# Patient Record
Sex: Female | Born: 1985 | Race: Black or African American | Hispanic: No | Marital: Single | Smoking: Never smoker
Health system: Southern US, Community
[De-identification: ages and names within clinical notes are randomized; demographics above are authoritative.]

## PROBLEM LIST (undated history)

## (undated) ENCOUNTER — Inpatient Hospital Stay (HOSPITAL_COMMUNITY): Payer: Self-pay

## (undated) DIAGNOSIS — O139 Gestational [pregnancy-induced] hypertension without significant proteinuria, unspecified trimester: Secondary | ICD-10-CM

## (undated) HISTORY — PX: NO PAST SURGERIES: SHX2092

---
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
## 2012-11-28 NOTE — L&D Delivery Note (Signed)
Delivery Note At 2:46 PM a viable female was delivered via  (Presentation: LOA).  APGAR: 9,9; weight - pending. Placenta status: Intact, Spontaneous.  Cord:  3 vessel with the following complications: None.  Anesthesia:  None Episiotomy: None Lacerations: None Suture Repair: N/A Est. Blood Loss (mL): 50 mL  Mom to postpartum.  Baby to nursery-stable. Will obtain stat Prenatal labs.   Everlene Other 05/24/2013, 3:04 PM  Attended delivery also Agree with note BP noted to be quite elevated after delivery PIH labs ordered and Magnesium Sulfate started Wynelle Bourgeois CNM

## 2012-11-28 NOTE — L&D Delivery Note (Signed)
Attestation of Attending Supervision of Advanced Practitioner (PA/CNM/NP): Evaluation and management procedures were performed by the Advanced Practitioner under my supervision and collaboration.  I have reviewed the Advanced Practitioner's note and chart, and I agree with the management and plan.  Mavis Gravelle, MD, FACOG Attending Obstetrician & Gynecologist Faculty Practice, [facility]  

## 2013-05-24 ENCOUNTER — Encounter (HOSPITAL_COMMUNITY): Payer: Self-pay

## 2013-05-24 ENCOUNTER — Inpatient Hospital Stay (HOSPITAL_COMMUNITY)
Admission: AD | Admit: 2013-05-24 | Discharge: 2013-05-26 | DRG: 774 | Disposition: A | Discharge status: Home / Self Care | Payer: MEDICAID | Source: Ambulatory Visit | Attending: Obstetrics & Gynecology | Admitting: Obstetrics & Gynecology

## 2013-05-24 DIAGNOSIS — D649 Anemia, unspecified: Secondary | ICD-10-CM

## 2013-05-24 DIAGNOSIS — IMO0002 Reserved for concepts with insufficient information to code with codable children: Principal | ICD-10-CM | POA: Diagnosis not present

## 2013-05-24 DIAGNOSIS — O9903 Anemia complicating the puerperium: Secondary | ICD-10-CM | POA: Diagnosis not present

## 2013-05-24 DIAGNOSIS — O0930 Supervision of pregnancy with insufficient antenatal care, unspecified trimester: Secondary | ICD-10-CM

## 2013-05-24 DIAGNOSIS — O1493 Unspecified pre-eclampsia, third trimester: Secondary | ICD-10-CM

## 2013-05-24 HISTORY — DX: Gestational (pregnancy-induced) hypertension without significant proteinuria, unspecified trimester: O13.9

## 2013-05-24 LAB — CBC
MCH: 20.4 pg — ABNORMAL LOW (ref 26.0–34.0)
RBC: 4.26 MIL/uL (ref 3.87–5.11)
RDW: 18.3 % — ABNORMAL HIGH (ref 11.5–15.5)
WBC: 11.1 10*3/uL — ABNORMAL HIGH (ref 4.0–10.5)

## 2013-05-24 LAB — PROTEIN / CREATININE RATIO, URINE
Creatinine, Urine: 64.47 mg/dL
Total Protein, Urine: 178.9 mg/dL

## 2013-05-24 LAB — COMPREHENSIVE METABOLIC PANEL
ALT: 5 U/L (ref 0–35)
AST: 14 U/L (ref 0–37)
BUN: <3 mg/dL — ABNORMAL LOW (ref 6–23)
Calcium: 8.9 mg/dL (ref 8.4–10.5)
Chloride: 102 mEq/L (ref 96–112)
Creatinine, Ser: 0.58 mg/dL (ref 0.50–1.10)
GFR calc Af Amer: >90 mL/min (ref >90)
Total Bilirubin: 0.6 mg/dL (ref 0.3–1.2)

## 2013-05-24 LAB — RAPID URINE DRUG SCREEN, HOSP PERFORMED
Amphetamines: NOT DETECTED
Barbiturates: NOT DETECTED
Tetrahydrocannabinol: NOT DETECTED

## 2013-05-24 LAB — DIFFERENTIAL
Basophils Absolute: 0 10*3/uL (ref 0.0–0.1)
Basophils Relative: 0 % (ref 0–1)
Eosinophils Absolute: 0.1 10*3/uL (ref 0.0–0.7)
Eosinophils Relative: 1 % (ref 0–5)
Lymphocytes Relative: 12 % (ref 12–46)
Lymphs Abs: 1.4 10*3/uL (ref 0.7–4.0)
Monocytes Relative: 5 % (ref 3–12)
Neutro Abs: 9.2 10*3/uL — ABNORMAL HIGH (ref 1.7–7.7)

## 2013-05-24 LAB — HEPATITIS B SURFACE ANTIGEN: Hepatitis B Surface Ag: NEGATIVE

## 2013-05-24 LAB — RPR: RPR Ser Ql: NONREACTIVE

## 2013-05-24 LAB — TYPE AND SCREEN
ABO/RH(D): A POS
Antibody Screen: NEGATIVE

## 2013-05-24 LAB — RAPID HIV SCREEN (WH-MAU): Rapid HIV Screen: NONREACTIVE

## 2013-05-24 MED ORDER — OXYTOCIN 10 UNIT/ML IJ SOLN
10.0000 [IU] | Freq: Once | INTRAMUSCULAR | Status: AC
  Administered 2013-05-24: 10 [IU] via INTRAMUSCULAR

## 2013-05-24 MED ORDER — LIDOCAINE HCL (PF) 1 % IJ SOLN
30.0000 mL | INTRAMUSCULAR | Status: DC | PRN
  Filled 2013-05-24: qty 30

## 2013-05-24 MED ORDER — SENNOSIDES-DOCUSATE SODIUM 8.6-50 MG PO TABS
2.0000 | ORAL_TABLET | Freq: Every day | ORAL | Status: DC
  Administered 2013-05-24 – 2013-05-25 (×2): 2 via ORAL

## 2013-05-24 MED ORDER — CITRIC ACID-SODIUM CITRATE 334-500 MG/5ML PO SOLN: 30.0000 mL | ORAL | Status: DC | PRN

## 2013-05-24 MED ORDER — IBUPROFEN 600 MG PO TABS: 600.0000 mg | ORAL_TABLET | Freq: Four times a day (QID) | ORAL | Status: DC | PRN

## 2013-05-24 MED ORDER — POTASSIUM CHLORIDE 10 MEQ/100ML IV SOLN
10.0000 meq | INTRAVENOUS | Status: AC
  Administered 2013-05-24 (×2): 10 meq via INTRAVENOUS
  Filled 2013-05-24 (×2): qty 100

## 2013-05-24 MED ORDER — HYDRALAZINE HCL 20 MG/ML IJ SOLN
INTRAMUSCULAR | Status: AC
  Administered 2013-05-24: 20 mg
  Filled 2013-05-24: qty 1

## 2013-05-24 MED ORDER — ONDANSETRON HCL 4 MG PO TABS: 4.0000 mg | ORAL_TABLET | ORAL | Status: DC | PRN

## 2013-05-24 MED ORDER — ACETAMINOPHEN 325 MG PO TABS: 650.0000 mg | ORAL_TABLET | ORAL | Status: DC | PRN

## 2013-05-24 MED ORDER — ONDANSETRON HCL 4 MG/2ML IJ SOLN: 4.0000 mg | Freq: Four times a day (QID) | INTRAMUSCULAR | Status: DC | PRN

## 2013-05-24 MED ORDER — HYDRALAZINE HCL 20 MG/ML IJ SOLN: 20.0000 mg | INTRAMUSCULAR | Status: DC | PRN

## 2013-05-24 MED ORDER — SIMETHICONE 80 MG PO CHEW: 80.0000 mg | CHEWABLE_TABLET | ORAL | Status: DC | PRN

## 2013-05-24 MED ORDER — MAGNESIUM SULFATE 40 G IN LACTATED RINGERS - SIMPLE
2.0000 g/h | INTRAVENOUS | Status: DC
  Administered 2013-05-24: 2 g/h via INTRAVENOUS
  Filled 2013-05-24: qty 500

## 2013-05-24 MED ORDER — OXYTOCIN BOLUS FROM INFUSION: 500.0000 mL | INTRAVENOUS | Status: DC

## 2013-05-24 MED ORDER — IBUPROFEN 600 MG PO TABS
600.0000 mg | ORAL_TABLET | Freq: Four times a day (QID) | ORAL | Status: DC
Start: 2013-05-24 — End: 2013-05-26
  Administered 2013-05-24 – 2013-05-26 (×8): 600 mg via ORAL
  Filled 2013-05-24 (×8): qty 1

## 2013-05-24 MED ORDER — OXYCODONE-ACETAMINOPHEN 5-325 MG PO TABS: 1.0000 | ORAL_TABLET | ORAL | Status: DC | PRN

## 2013-05-24 MED ORDER — DIPHENHYDRAMINE HCL 25 MG PO CAPS: 25.0000 mg | ORAL_CAPSULE | Freq: Four times a day (QID) | ORAL | Status: DC | PRN

## 2013-05-24 MED ORDER — BENZOCAINE-MENTHOL 20-0.5 % EX AERO: 1.0000 "application " | INHALATION_SPRAY | CUTANEOUS | Status: DC | PRN

## 2013-05-24 MED ORDER — LACTATED RINGERS IV SOLN
INTRAVENOUS | Status: DC
  Administered 2013-05-25: 06:00:00 via INTRAVENOUS

## 2013-05-24 MED ORDER — LACTATED RINGERS IV SOLN
INTRAVENOUS | Status: DC
  Administered 2013-05-24: 17:00:00 via INTRAVENOUS

## 2013-05-24 MED ORDER — TETANUS-DIPHTH-ACELL PERTUSSIS 5-2.5-18.5 LF-MCG/0.5 IM SUSP
0.5000 mL | Freq: Once | INTRAMUSCULAR | Status: AC
  Administered 2013-05-25: 0.5 mL via INTRAMUSCULAR
  Filled 2013-05-24: qty 0.5

## 2013-05-24 MED ORDER — OXYTOCIN 40 UNITS IN LACTATED RINGERS INFUSION - SIMPLE MED: 62.5000 mL/h | INTRAVENOUS | Status: DC | PRN

## 2013-05-24 MED ORDER — WITCH HAZEL-GLYCERIN EX PADS: 1.0000 "application " | MEDICATED_PAD | CUTANEOUS | Status: DC | PRN

## 2013-05-24 MED ORDER — LANOLIN HYDROUS EX OINT: TOPICAL_OINTMENT | CUTANEOUS | Status: DC | PRN

## 2013-05-24 MED ORDER — MAGNESIUM SULFATE BOLUS VIA INFUSION
4.0000 g | Freq: Once | INTRAVENOUS | Status: AC
  Administered 2013-05-24: 4 g via INTRAVENOUS
  Filled 2013-05-24: qty 500

## 2013-05-24 MED ORDER — POTASSIUM CHLORIDE 10 MEQ/100ML IV SOLN
10.0000 meq | INTRAVENOUS | Status: DC
  Administered 2013-05-24 (×3): 10 meq via INTRAVENOUS
  Filled 2013-05-24 (×5): qty 100

## 2013-05-24 MED ORDER — ONDANSETRON HCL 4 MG/2ML IJ SOLN: 4.0000 mg | INTRAMUSCULAR | Status: DC | PRN

## 2013-05-24 MED ORDER — ZOLPIDEM TARTRATE 5 MG PO TABS: 5.0000 mg | ORAL_TABLET | Freq: Every evening | ORAL | Status: DC | PRN

## 2013-05-24 MED ORDER — PRENATAL MULTIVITAMIN CH
1.0000 | ORAL_TABLET | Freq: Every day | ORAL | Status: DC
  Administered 2013-05-25: 1 via ORAL
  Filled 2013-05-24: qty 1

## 2013-05-24 MED ORDER — MAGNESIUM SULFATE 40 G IN LACTATED RINGERS - SIMPLE
2.0000 g/h | INTRAVENOUS | Status: AC
  Filled 2013-05-24: qty 500

## 2013-05-24 MED ORDER — LABETALOL HCL 5 MG/ML IV SOLN: 10.0000 mg | Freq: Once | INTRAVENOUS | Status: DC

## 2013-05-24 MED ORDER — PRENATAL MULTIVITAMIN CH: 1.0000 | ORAL_TABLET | Freq: Every day | ORAL | Status: DC

## 2013-05-24 MED ORDER — HYDRALAZINE HCL 20 MG/ML IJ SOLN
10.0000 mg | INTRAMUSCULAR | Status: DC | PRN
  Administered 2013-05-24: 10 mg via INTRAVENOUS
  Filled 2013-05-24: qty 1

## 2013-05-24 MED ORDER — OXYTOCIN 40 UNITS IN LACTATED RINGERS INFUSION - SIMPLE MED: 62.5000 mL/h | INTRAVENOUS | Status: DC

## 2013-05-24 MED ORDER — OXYTOCIN 10 UNIT/ML IJ SOLN
INTRAMUSCULAR | Status: AC
  Filled 2013-05-24: qty 2

## 2013-05-24 MED ORDER — FLEET ENEMA 7-19 GM/118ML RE ENEM: 1.0000 | ENEMA | RECTAL | Status: DC | PRN

## 2013-05-24 MED ORDER — LABETALOL HCL 5 MG/ML IV SOLN
10.0000 mg | INTRAVENOUS | Status: DC | PRN
  Administered 2013-05-24 (×2): 10 mg via INTRAVENOUS
  Filled 2013-05-24 (×2): qty 4

## 2013-05-24 MED ORDER — LACTATED RINGERS IV SOLN: 500.0000 mL | INTRAVENOUS | Status: DC | PRN

## 2013-05-24 MED ORDER — OXYCODONE-ACETAMINOPHEN 5-325 MG PO TABS
1.0000 | ORAL_TABLET | ORAL | Status: DC | PRN
  Administered 2013-05-24 – 2013-05-26 (×4): 1 via ORAL
  Filled 2013-05-24: qty 2
  Filled 2013-05-24 (×4): qty 1

## 2013-05-24 MED ORDER — DIBUCAINE 1 % RE OINT: 1.0000 "application " | TOPICAL_OINTMENT | RECTAL | Status: DC | PRN

## 2013-05-24 NOTE — H&P (Signed)
Whitney Abbott is a 27 y.o. G4P3 who came via EMS with eminent delivery. Patient has recently relocated from [place] [facility]); she received prenatal care there.  History No complications during pregnancy per patient.   Labs:  Prenatal labs not available.  ROS Unable to obtain.  Exam Physical Exam  Gen: in moderate distress secondary to pain. Cervical exam: Dilation: 10 Dilation Complete Date: 05/24/13 Dilation Complete Time: 1436 Cervical Position: Anterior Station: +2 Presentation: Vertex Exam by:: m wilkins rnc  Assessment/Plan: CHARMIAN FORBIS is a 27 y.o. G4P3 who presents via EMS. Delivery eminent. - Patient delivered viable girl without complications.   Everlene Other 05/24/2013, 3:07 PM  Seen Dollene Cleveland me Agree with note Wynelle Bourgeois CNM

## 2013-05-25 LAB — BASIC METABOLIC PANEL
BUN: 4 mg/dL — ABNORMAL LOW (ref 6–23)
CO2: 24 mEq/L (ref 19–32)
Chloride: 104 mEq/L (ref 96–112)
GFR calc non Af Amer: >90 mL/min (ref >90)
Glucose, Bld: 116 mg/dL — ABNORMAL HIGH (ref 70–99)
Potassium: 2.8 mEq/L — ABNORMAL LOW (ref 3.5–5.1)

## 2013-05-25 LAB — CBC
Hemoglobin: 7.2 g/dL — ABNORMAL LOW (ref 12.0–15.0)
MCHC: 30.5 g/dL (ref 30.0–36.0)
RBC: 3.46 MIL/uL — ABNORMAL LOW (ref 3.87–5.11)

## 2013-05-25 LAB — COMPREHENSIVE METABOLIC PANEL
ALT: <5 U/L (ref 0–35)
Alkaline Phosphatase: 158 U/L — ABNORMAL HIGH (ref 39–117)
GFR calc Af Amer: >90 mL/min (ref >90)
Glucose, Bld: 80 mg/dL (ref 70–99)
Potassium: 2.5 mEq/L — CL (ref 3.5–5.1)
Sodium: 138 mEq/L (ref 135–145)
Total Protein: 5.1 g/dL — ABNORMAL LOW (ref 6.0–8.3)

## 2013-05-25 MED ORDER — POTASSIUM CHLORIDE 10 MEQ/100ML IV SOLN
10.0000 meq | INTRAVENOUS | Status: AC
  Administered 2013-05-25 (×4): 10 meq via INTRAVENOUS
  Filled 2013-05-25 (×4): qty 100

## 2013-05-25 MED ORDER — LABETALOL HCL 200 MG PO TABS
400.0000 mg | ORAL_TABLET | Freq: Two times a day (BID) | ORAL | Status: DC
  Administered 2013-05-26: 400 mg via ORAL
  Filled 2013-05-25 (×3): qty 2

## 2013-05-25 MED ORDER — LABETALOL HCL 200 MG PO TABS
200.0000 mg | ORAL_TABLET | Freq: Two times a day (BID) | ORAL | Status: DC
  Administered 2013-05-25 (×2): 200 mg via ORAL
  Filled 2013-05-25 (×3): qty 1

## 2013-05-25 MED ORDER — LABETALOL HCL 200 MG PO TABS
200.0000 mg | ORAL_TABLET | Freq: Once | ORAL | Status: AC
  Administered 2013-05-25: 200 mg via ORAL
  Filled 2013-05-25: qty 1

## 2013-05-25 MED ORDER — POTASSIUM CHLORIDE 10 MEQ/100ML IV SOLN
10.0000 meq | INTRAVENOUS | Status: AC
  Administered 2013-05-25 – 2013-05-26 (×4): 10 meq via INTRAVENOUS
  Filled 2013-05-25 (×4): qty 100

## 2013-05-25 NOTE — Progress Notes (Signed)
Post Partum Day 1, minimal prenatal care Subjective: no complaints and tolerating PO  Objective: Blood pressure 157/95, pulse 91, temperature 97.6 F (36.4 C), temperature source Oral, resp. rate 16, height 5\' 4"  (1.626 m), weight 185 lb 3 oz (84 kg), SpO2 98.00%, unknown if currently breastfeeding.  Physical Exam:  General: alert, cooperative and no distress Lochia: appropriate Uterine Fundus: firm Incision: na DVT Evaluation: No evidence of DVT seen on physical exam.  Results for orders placed during the hospital encounter of 05/24/13 (from the past 6 hour(s))  CBC   Collection Time    05/25/13  5:50 AM      Result Value Range   WBC 11.6 (*) 4.0 - 10.5 K/uL   RBC 3.46 (*) 3.87 - 5.11 MIL/uL   Hemoglobin 7.2 (*) 12.0 - 15.0 g/dL   HCT 21.3 (*) 08.6 - 57.8 %   MCV 67.3 (*) 78.0 - 100.0 fL   MCH 20.5 (*) 26.0 - 34.0 pg   MCHC 30.5  30.0 - 36.0 g/dL   RDW 46.9 (*) 62.9 - 52.8 %   Platelets 220  150 - 400 K/uL     CMP pending Assessment/Plan: PPD #1: Pre eclampsia Anemic  Start Labetalol 200 mg BID Checking K, pending Stop Magenesium later   LOS: 1 day   EURE,LUTHER H 05/25/2013, 7:14 AM

## 2013-05-25 NOTE — Plan of Care (Signed)
Problem: Discharge Progression Outcomes Goal: Other Discharge Outcomes/Goals Pt is planning for baby to be adopted.  SW and [facility] involved.

## 2013-05-25 NOTE — Progress Notes (Signed)
Results for SHAYLEA, [facility] (MRN [id number]) as of 05/25/2013 08:01  Ref. Range 05/25/2013 05:50  Potassium Latest Range: 3.5-5.1 mEq/L 2.5 (LL)  Dr. Despina Hidden notified. Orders received.

## 2013-05-25 NOTE — H&P (Signed)
Attestation of Attending Supervision of Advanced Practitioner (PA/CNM/NP): Evaluation and management procedures were performed by the Advanced Practitioner under my supervision and collaboration.  I have reviewed the Advanced Practitioner's note and chart, and I agree with the management and plan.  Tasha Jindra, MD, FACOG Attending Obstetrician & Gynecologist Faculty Practice, [facility]  

## 2013-05-25 NOTE — Progress Notes (Signed)
Pt transferred stable and ambulatory from AICU to room 320.RN received report from [employer] RN.

## 2013-05-25 NOTE — Clinical Social Work Maternal (Signed)
Clinical Social Work Department PSYCHOSOCIAL ASSESSMENT - MATERNAL/CHILD 05/25/2013  Patient:  Whitney Abbott, Whitney Abbott  Account Number:  [id number]  Admit Date:  05/24/2013  Marjo Bicker Name:   Whitney Abbott    Clinical Social Worker:  Nobie Putnam, LCSW   Date/Time:  05/25/2013 11:58 AM  Date Referred:  05/25/2013   Referral source  CN     Referred reason  Adoption  Other - See comment   Other referral source:    I:  FAMILY / HOME ENVIRONMENT Child's legal guardian:  PARENT  Guardian - Name Guardian - Age Guardian - Address  Ranya Fiddler 26 [address]; [place], [place] [address]  Lannie Fields 25 [place]   Other household support members/support persons Name Relationship DOB  Fuller [place] AUNT    SON 74 years old   DAUGHTER 36 years old   DAUGHTER 47 years old   Other support:   Pt's mother  Daughter's grandmother    II  PSYCHOSOCIAL DATA Information Source:  Patient Interview  Event organiser Employment:   Surveyor, quantity resources:  Self Pay If [employer]:   Building services engineer / Grade:   Maternity Care Coordinator / Child Services Coordination / Early Interventions:  Cultural issues impacting care:    III  STRENGTHS Strengths  Supportive family/friends   Strength comment:    IV  RISK FACTORS AND CURRENT PROBLEMS Current Problem:  YES   Risk Factor & Current Problem Patient Issue Family Issue Risk Factor / Current Problem Comment  Other - See comment Y N NPNC  Other - See comment Y N ?able Adoption    V  SOCIAL WORK ASSESSMENT CSW met with pt to assess reason for [facility].  Pt did not established PNC due to her instability & lack of transportation.  She is originally from [place].  Pt moved to [place], [place] with her mother & stayed for 6 months. Pt described the town as [place], [place] without public transportation. She decided to relocate to the area 1 month ago to live with her aunt.  During assessment pt asked "what can I do if I don't want to  keep the baby?" CSW discussed adoption services available to her & offered to facilitate the process.  Pt told CSW that she is having a difficult time raising 3 young children alone.  Due to her lack of finances, she thinks it will be even more difficult to "get on her feet."  She talked about her desire to finish college & adding a baby to the situation will further delay her goals.  CSW educated pt on [place] adoption laws & differences between open/closed adoptions.  The FOB is in [place] & not involved.  The pt talked about her decision with her mother during the pregnancy & has her support. She has not purchased any supplies for the infant.  CSW provided pt with a list of adoption agencies & pt asked CSW to call [facility].  CSW spoke with Paulette, adoption SW & she plans to come meet with the pt today.  Pt denies any illegal substance use & verbalized understanding of hospital drug testing policy.  UDS is negative, meconium results are pending.  Pt would like to continue to have contact with the infant at this time.  Pt seems to be ambivalent about her decision to make an adoption plan at this time however interested in speaking with adoption SW. CSW will continue to assist the pt as needed &  monitor drug screen results.      VI SOCIAL WORK PLAN Social Work Plan  No Further Intervention Required / No Barriers to Discharge   Type of pt/family education:   If child protective services report - county:   If child protective services report - date:   Information/referral to community resources comment:   Other social work plan:

## 2013-05-26 LAB — COMPREHENSIVE METABOLIC PANEL
BUN: 5 mg/dL — ABNORMAL LOW (ref 6–23)
CO2: 24 mEq/L (ref 19–32)
Chloride: 107 mEq/L (ref 96–112)
Creatinine, Ser: 0.68 mg/dL (ref 0.50–1.10)
GFR calc Af Amer: >90 mL/min (ref >90)
GFR calc non Af Amer: >90 mL/min (ref >90)
Glucose, Bld: 73 mg/dL (ref 70–99)
Total Bilirubin: 0.3 mg/dL (ref 0.3–1.2)

## 2013-05-26 MED ORDER — IBUPROFEN 600 MG PO TABS: 600.0000 mg | ORAL_TABLET | Freq: Four times a day (QID) | ORAL | Status: DC

## 2013-05-26 MED ORDER — LABETALOL HCL 200 MG PO TABS: 400.0000 mg | ORAL_TABLET | Freq: Two times a day (BID) | ORAL | Status: DC

## 2013-05-26 NOTE — Progress Notes (Signed)
Adoption SW, Mel Almond came to meet with the pt yesterday afternoon to discuss adoption plan.  MOB plans to sign relinquishment paperwork this morning.  She does not appear emotional & states she is confident with her decision.  Pt's aunt did not know she was pregnant.  She plans to discharge home today.  CSW offered to make a counseling referral however pt declined.  CSW will assist Paulette when she arrives.

## 2013-05-26 NOTE — Discharge Summary (Signed)
Obstetric Discharge Summary Reason for Admission: onset of labor Prenatal Procedures: limited prenatal care in [place] Intrapartum Procedures: spontaneous vaginal delivery Postpartum Procedures: preeclampsia Hemoglobin  Date Value Range Status  05/25/2013 7.2* 12.0 - 15.0 g/dL Final     REPEATED TO VERIFY     DELTA CHECK NOTED     HCT  Date Value Range Status  05/25/2013 23.3* 36.0 - 46.0 % Final   Patient was brought in by EMS and delivered a viable female infant in MAU (no laceration). Patient with limited prenatal care in [place], noted to have elevated BP postpartum. Patient diagnosed with preeclampsia and received magnesium sulfate for seizure prophylaxis. Patient also started on labetalol. Patient was found to be hypokalemic which was corrected with IV KCL infusions. On day of discharge, patient reports feeling well without any symptoms. She is not interested in breast feeding. She plans on using depo-provera or Nexplanon for birth control. Appointments will be scheduled for her in the next 2-4 weeks for depo-provera and postpartum visit. In the mean time baby love nurse will make a home assessment with BP check. Patient advised to return to MAU if she develops headaches, visual disturbances, RUQ/epigastric pain. Patient advised to abstain from intercourse until postpartum visit.   Physical Exam:  General: alert, cooperative and no distress Lochia: appropriate Uterine Fundus: firm, NT DVT Evaluation: No cords or calf tenderness. No significant calf/ankle edema.  Discharge Diagnoses: Term Pregnancy-delivered and Preelampsia  Discharge Information: Date: 05/26/2013 Activity: pelvic rest Diet: routine Medications: Ibuprofen and labetalol Condition: stable Instructions: refer to practice specific booklet Discharge to: home Follow-up Information   Follow up with [facility] In 2 weeks. (Depo-provera)    Contact information:   [address] [place]  [address] [phone]      Newborn Data: Live born female  Birth Weight: 7 lb 5.6 oz (3334 g) APGAR: 9, 9  Home with mother.  Whitney Abbott 05/26/2013, 7:10 AM

## 2013-05-27 NOTE — Progress Notes (Signed)
Ur chart review completed.  

## 2013-06-27 ENCOUNTER — Ambulatory Visit: Payer: Self-pay | Admitting: Obstetrics and Gynecology

## 2013-09-02 ENCOUNTER — Ambulatory Visit: Payer: Self-pay | Admitting: Internal Medicine

## 2013-12-16 LAB — OB RESULTS CONSOLE GBS: STREP GROUP B AG: POSITIVE

## 2014-01-31 ENCOUNTER — Ambulatory Visit: Payer: Self-pay

## 2014-06-10 ENCOUNTER — Emergency Department (HOSPITAL_COMMUNITY)
Admission: EM | Admit: 2014-06-10 | Discharge: 2014-06-10 | Disposition: A | Discharge status: Home / Self Care | Payer: Medicaid Other | Attending: Emergency Medicine | Admitting: Emergency Medicine

## 2014-06-10 ENCOUNTER — Encounter (HOSPITAL_COMMUNITY): Payer: Self-pay | Admitting: Emergency Medicine

## 2014-06-10 DIAGNOSIS — Z8679 Personal history of other diseases of the circulatory system: Secondary | ICD-10-CM | POA: Insufficient documentation

## 2014-06-10 DIAGNOSIS — O210 Mild hyperemesis gravidarum: Secondary | ICD-10-CM | POA: Insufficient documentation

## 2014-06-10 DIAGNOSIS — O219 Vomiting of pregnancy, unspecified: Secondary | ICD-10-CM

## 2014-06-10 LAB — PREGNANCY, URINE: PREG TEST UR: POSITIVE — AB

## 2014-06-10 LAB — CBC WITH DIFFERENTIAL/PLATELET
BASOS ABS: 0 10*3/uL (ref 0.0–0.1)
BASOS PCT: 0 % (ref 0–1)
Eosinophils Absolute: 0.1 10*3/uL (ref 0.0–0.7)
Eosinophils Relative: 1 % (ref 0–5)
HCT: 37.8 % (ref 36.0–46.0)
HEMOGLOBIN: 12.5 g/dL (ref 12.0–15.0)
LYMPHS PCT: 26 % (ref 12–46)
Lymphs Abs: 1.5 10*3/uL (ref 0.7–4.0)
MCH: 27.2 pg (ref 26.0–34.0)
MCHC: 33.1 g/dL (ref 30.0–36.0)
MCV: 82.2 fL (ref 78.0–100.0)
MONOS PCT: 6 % (ref 3–12)
Monocytes Absolute: 0.3 10*3/uL (ref 0.1–1.0)
NEUTROS ABS: 3.9 10*3/uL (ref 1.7–7.7)
NEUTROS PCT: 67 % (ref 43–77)
Platelets: 253 10*3/uL (ref 150–400)
RBC: 4.6 MIL/uL (ref 3.87–5.11)
RDW: 15.7 % — AB (ref 11.5–15.5)
WBC: 5.9 10*3/uL (ref 4.0–10.5)

## 2014-06-10 LAB — URINALYSIS, ROUTINE W REFLEX MICROSCOPIC
Bilirubin Urine: NEGATIVE
Glucose, UA: NEGATIVE mg/dL
Hgb urine dipstick: NEGATIVE
Ketones, ur: 40 mg/dL — AB
LEUKOCYTES UA: NEGATIVE
NITRITE: NEGATIVE
Protein, ur: NEGATIVE mg/dL
SPECIFIC GRAVITY, URINE: 1.02 (ref 1.005–1.030)
UROBILINOGEN UA: 1 mg/dL (ref 0.0–1.0)
pH: 7.5 (ref 5.0–8.0)

## 2014-06-10 LAB — URINE MICROSCOPIC-ADD ON

## 2014-06-10 LAB — BASIC METABOLIC PANEL
ANION GAP: 16 — AB (ref 5–15)
BUN: 7 mg/dL (ref 6–23)
CHLORIDE: 102 meq/L (ref 96–112)
CO2: 21 meq/L (ref 19–32)
Calcium: 9.5 mg/dL (ref 8.4–10.5)
Creatinine, Ser: 0.65 mg/dL (ref 0.50–1.10)
GFR calc non Af Amer: >90 mL/min (ref >90)
Glucose, Bld: 75 mg/dL (ref 70–99)
POTASSIUM: 4.4 meq/L (ref 3.7–5.3)
Sodium: 139 mEq/L (ref 137–147)

## 2014-06-10 MED ORDER — SODIUM CHLORIDE 0.9 % IV BOLUS (SEPSIS)
1000.0000 mL | Freq: Once | INTRAVENOUS | Status: AC
  Administered 2014-06-10: 1000 mL via INTRAVENOUS

## 2014-06-10 MED ORDER — ONDANSETRON HCL 4 MG/2ML IJ SOLN
4.0000 mg | Freq: Once | INTRAMUSCULAR | Status: AC
  Administered 2014-06-10: 4 mg via INTRAVENOUS
  Filled 2014-06-10: qty 2

## 2014-06-10 MED ORDER — PRENATAL COMPLETE 14-0.4 MG PO TABS: 1.0000 | ORAL_TABLET | Freq: Every day | ORAL | Status: DC

## 2014-06-10 MED ORDER — ONDANSETRON HCL 4 MG PO TABS: 4.0000 mg | ORAL_TABLET | Freq: Four times a day (QID) | ORAL | Status: DC

## 2014-06-10 NOTE — Discharge Instructions (Signed)
Hyperemesis Gravidarum °Hyperemesis gravidarum is a severe form of nausea and vomiting that happens during pregnancy. Hyperemesis is worse than morning sickness. It may cause you to have nausea or vomiting all day for many days. It may keep you from eating and drinking enough food and liquids. Hyperemesis usually occurs during the first half (the first 20 weeks) of pregnancy. It often goes away once a woman is in her second half of pregnancy. However, sometimes hyperemesis continues through an entire pregnancy.  °CAUSES  °The cause of this condition is not completely known but is thought to be related to changes in the body's hormones when pregnant. It could be from the high level of the pregnancy hormone or an increase in estrogen in the body.  °SIGNS AND SYMPTOMS  °· Severe nausea and vomiting. °· Nausea that does not go away. °· Vomiting that does not allow you to keep any food down. °· Weight loss and body fluid loss (dehydration). °· Having no desire to eat or not liking food you have previously enjoyed. °DIAGNOSIS  °Your health care provider will do a physical exam and ask you about your symptoms. He or she may also order blood tests and urine tests to make sure something else is not causing the problem.  °TREATMENT  °You may only need medicine to control the problem. If medicines do not control the nausea and vomiting, you will be treated in the hospital to prevent dehydration, increased acid in the blood (acidosis), weight loss, and changes in the electrolytes in your body that may harm the unborn baby (fetus). You may need IV fluids.  °HOME CARE INSTRUCTIONS  °· Only take over-the-counter or prescription medicines as directed by your health care provider. °· Try eating a couple of dry crackers or toast in the morning before getting out of bed. °· Avoid foods and smells that upset your stomach. °· Avoid fatty and spicy foods. °· Eat 5-6 small meals a day. °· Do not drink when eating meals. Drink between  meals. °· For snacks, eat high-protein foods, such as cheese. °· Eat or suck on things that have ginger in them. Ginger helps nausea. °· Avoid food preparation. The smell of food can spoil your appetite. °· Avoid iron pills and iron in your multivitamins until after 3-4 months of being pregnant. However, consult with your health care provider before stopping any prescribed iron pills. °SEEK MEDICAL CARE IF:  °· Your abdominal pain increases. °· You have a severe headache. °· You have vision problems. °· You are losing weight. °SEEK IMMEDIATE MEDICAL CARE IF:  °· You are unable to keep fluids down. °· You vomit blood. °· You have constant nausea and vomiting. °· You have excessive weakness. °· You have extreme thirst. °· You have dizziness or fainting. °· You have a fever or persistent symptoms for more than 2-3 days. °· You have a fever and your symptoms suddenly get worse. °MAKE SURE YOU:  °· Understand these instructions. °· Will watch your condition. °· Will get help right away if you are not doing well or get worse. °Document Released: 11/14/2005 Document Revised: 09/04/2013 Document Reviewed: 06/26/2013 °ExitCare® Patient Information ©2015 [employer]. This information is not intended to replace advice given to you by your health care provider. Make sure you discuss any questions you have with your health care provider. ° °

## 2014-06-10 NOTE — ED Provider Notes (Signed)
CSN: [id number]     Arrival date & time 06/10/14  [id number] History   First MD Initiated Contact with Patient 06/10/14 [phone]     Chief Complaint  Patient presents with  . Emesis During Pregnancy     (Consider location/radiation/quality/duration/timing/severity/associated sxs/prior Treatment) HPI Comments: Patient [place] with a past medical history of pregnancy induced hypertension who presents with nausea and vomiting for the past 3 days. Symptoms started gradually and remained constant since the onset. Patient reports her last period was about 2 months ago. She denies any abdominal pain, vaginal bleeding/discharge, fever. She did not try anything for symptoms at home. No aggravating/alleviating factors. Patient reports not being able to keep and food or drink down. No other associated symptoms.    Past Medical History  Diagnosis Date  . Pregnancy induced hypertension    History reviewed. No pertinent past surgical history. History reviewed. No pertinent family history. History  Substance Use Topics  . Smoking status: Never Smoker   . Smokeless tobacco: Never Used  . Alcohol Use: No   OB History   Grav Para Term Preterm Abortions TAB SAB Ect Mult Living   5 4        1      Review of Systems  Constitutional: Negative for fever, chills and fatigue.  HENT: Negative for trouble swallowing.   Eyes: Negative for visual disturbance.  Respiratory: Negative for shortness of breath.   Cardiovascular: Negative for chest pain and palpitations.  Gastrointestinal: Positive for nausea and vomiting. Negative for abdominal pain and diarrhea.  Genitourinary: Negative for dysuria and difficulty urinating.  Musculoskeletal: Negative for arthralgias and neck pain.  Skin: Negative for color change.  Neurological: Negative for dizziness and weakness.  Psychiatric/Behavioral: Negative for dysphoric mood.      Allergies  Review of patient's allergies indicates no known allergies.  Home  Medications   Prior to Admission medications   Not on File   BP 139/74  Pulse 66  Temp(Src) 98.5 F (36.9 C) (Oral)  Resp 20  Wt 162 lb (73.483 kg)  SpO2 100%  LMP 04/11/2014 Physical Exam  Nursing note and vitals reviewed. Constitutional: She is oriented to person, place, and time. She appears well-developed and well-nourished. No distress.  HENT:  Head: Normocephalic and atraumatic.  Eyes: Conjunctivae are normal.  Neck: Normal range of motion.  Cardiovascular: Normal rate and regular rhythm.  Exam reveals no gallop and no friction rub.   No murmur heard. Pulmonary/Chest: Effort normal and breath sounds normal. She has no wheezes. She has no rales. She exhibits no tenderness.  Abdominal: Soft. She exhibits no distension. There is no tenderness. There is no rebound and no guarding.  Musculoskeletal: Normal range of motion.  Neurological: She is alert and oriented to person, place, and time. Coordination normal.  Speech is goal-oriented. Moves limbs without ataxia.   Skin: Skin is warm and dry.  Psychiatric: She has a normal mood and affect. Her behavior is normal.    ED Course  Procedures (including critical care time) Labs Review Labs Reviewed  CBC WITH DIFFERENTIAL - Abnormal; Notable for the following:    RDW 15.7 (*)    All other components within normal limits  BASIC METABOLIC PANEL - Abnormal; Notable for the following:    Anion gap 16 (*)    All other components within normal limits  URINALYSIS, ROUTINE W REFLEX MICROSCOPIC - Abnormal; Notable for the following:    APPearance TURBID (*)    Ketones, ur 40 (*)  All other components within normal limits  PREGNANCY, URINE - Abnormal; Notable for the following:    Preg Test, Ur POSITIVE (*)    All other components within normal limits  URINE MICROSCOPIC-ADD ON    Imaging Review No results found.   EKG Interpretation None      MDM   Final diagnoses:  Nausea and vomiting during pregnancy    8:54  AM Labs and urinalysis pending. Vitals stable and patient afebrile. Patient will have fluids and zofran.   Patient signed out to Elpidio Anis, PA-C.    Emilia Beck, PA-C 06/10/14 1435

## 2014-06-10 NOTE — ED Notes (Signed)
PA at bedside.

## 2014-06-10 NOTE — ED Notes (Addendum)
Pt in c/o continued n/v over the last few days, states she recently found out she was pregnant, LMP 5/15, tested positive for pregnancy in June, states she has set up an OB appointment 7/29 but is here to get help with nausea until that time. Pt is G4P3. Denies pain or vaginal discharge/bleeding.

## 2014-06-10 NOTE — ED Provider Notes (Signed)
She feels much better after Zofran and fluids. No distress, resting comfortably without nausea currently. She continues to endorse no abdominal pain, vaginal bleeding or discharge. Stable for discharge home.   Arnoldo HookerShari A Christphor Groft, PA-C 06/10/14 1133

## 2014-06-11 NOTE — ED Provider Notes (Signed)
Medical screening examination/treatment/procedure(s) were performed by non-physician practitioner and as supervising physician I was immediately available for consultation/collaboration.   EKG Interpretation None        Bao Bazen T Ashan Cueva, MD 06/11/14 1551 

## 2014-06-11 NOTE — ED Provider Notes (Signed)
Medical screening examination/treatment/procedure(s) were performed by non-physician practitioner and as supervising physician I was immediately available for consultation/collaboration.   EKG Interpretation None        Audree CamelScott T Braedan Meuth, MD 06/11/14 1551

## 2014-08-07 LAB — CULTURE, OB URINE: URINE CULTURE, OB: NEGATIVE

## 2014-08-07 LAB — OB RESULTS CONSOLE HEPATITIS B SURFACE ANTIGEN: HEP B S AG: NEGATIVE

## 2014-08-07 LAB — OB RESULTS CONSOLE RPR: RPR: NONREACTIVE

## 2014-08-07 LAB — OB RESULTS CONSOLE HGB/HCT, BLOOD
HCT: 36 %
Hemoglobin: 11 g/dL

## 2014-08-07 LAB — OB RESULTS CONSOLE ABO/RH: RH TYPE: POSITIVE

## 2014-08-07 LAB — OB RESULTS CONSOLE PLATELET COUNT: Platelets: 311 10*3/uL

## 2014-08-07 LAB — OB RESULTS CONSOLE RUBELLA ANTIBODY, IGM: RUBELLA: IMMUNE

## 2014-08-07 LAB — OB RESULTS CONSOLE GC/CHLAMYDIA
Chlamydia: NEGATIVE
GC PROBE AMP, GENITAL: NEGATIVE

## 2014-08-07 LAB — OB RESULTS CONSOLE HIV ANTIBODY (ROUTINE TESTING): HIV: NONREACTIVE

## 2014-09-25 ENCOUNTER — Encounter: Payer: Self-pay | Admitting: *Deleted

## 2014-10-02 ENCOUNTER — Encounter: Payer: Medicaid Other | Admitting: Obstetrics & Gynecology

## 2014-10-09 ENCOUNTER — Encounter: Payer: Self-pay | Admitting: Obstetrics & Gynecology

## 2014-10-09 ENCOUNTER — Encounter: Payer: Medicaid Other | Admitting: Family Medicine

## 2014-10-20 ENCOUNTER — Other Ambulatory Visit (HOSPITAL_COMMUNITY)
Admission: RE | Admit: 2014-10-20 | Discharge: 2014-10-20 | Disposition: A | Discharge status: Home / Self Care | Payer: Medicaid Other | Source: Ambulatory Visit | Attending: Obstetrics and Gynecology | Admitting: Obstetrics and Gynecology

## 2014-10-20 ENCOUNTER — Ambulatory Visit (INDEPENDENT_AMBULATORY_CARE_PROVIDER_SITE_OTHER): Payer: Medicaid Other | Admitting: Obstetrics and Gynecology

## 2014-10-20 ENCOUNTER — Encounter: Payer: Self-pay | Admitting: Obstetrics and Gynecology

## 2014-10-20 VITALS — BP 129/92 | HR 112 | Wt 185.1 lb

## 2014-10-20 DIAGNOSIS — Z3492 Encounter for supervision of normal pregnancy, unspecified, second trimester: Secondary | ICD-10-CM

## 2014-10-20 DIAGNOSIS — Z8759 Personal history of other complications of pregnancy, childbirth and the puerperium: Secondary | ICD-10-CM | POA: Insufficient documentation

## 2014-10-20 DIAGNOSIS — O0993 Supervision of high risk pregnancy, unspecified, third trimester: Secondary | ICD-10-CM | POA: Insufficient documentation

## 2014-10-20 DIAGNOSIS — Z1151 Encounter for screening for human papillomavirus (HPV): Secondary | ICD-10-CM

## 2014-10-20 DIAGNOSIS — Z113 Encounter for screening for infections with a predominantly sexual mode of transmission: Secondary | ICD-10-CM | POA: Diagnosis not present

## 2014-10-20 DIAGNOSIS — O09213 Supervision of pregnancy with history of pre-term labor, third trimester: Secondary | ICD-10-CM

## 2014-10-20 DIAGNOSIS — Z118 Encounter for screening for other infectious and parasitic diseases: Secondary | ICD-10-CM

## 2014-10-20 DIAGNOSIS — O09219 Supervision of pregnancy with history of pre-term labor, unspecified trimester: Secondary | ICD-10-CM | POA: Insufficient documentation

## 2014-10-20 DIAGNOSIS — Z3483 Encounter for supervision of other normal pregnancy, third trimester: Secondary | ICD-10-CM

## 2014-10-20 DIAGNOSIS — O0933 Supervision of pregnancy with insufficient antenatal care, third trimester: Secondary | ICD-10-CM

## 2014-10-20 DIAGNOSIS — Z124 Encounter for screening for malignant neoplasm of cervix: Secondary | ICD-10-CM

## 2014-10-20 DIAGNOSIS — Z23 Encounter for immunization: Secondary | ICD-10-CM

## 2014-10-20 LAB — HIV ANTIBODY (ROUTINE TESTING W REFLEX): HIV: NONREACTIVE

## 2014-10-20 LAB — CBC
HCT: 29.2 % — ABNORMAL LOW (ref 36.0–46.0)
Hemoglobin: 9.5 g/dL — ABNORMAL LOW (ref 12.0–15.0)
MCH: 24.4 pg — ABNORMAL LOW (ref 26.0–34.0)
MCHC: 32.5 g/dL (ref 30.0–36.0)
MCV: 75.1 fL — ABNORMAL LOW (ref 78.0–100.0)
MPV: 10.1 fL (ref 9.4–12.4)
PLATELETS: 265 10*3/uL (ref 150–400)
RBC: 3.89 MIL/uL (ref 3.87–5.11)
RDW: 14.8 % (ref 11.5–15.5)
WBC: 9.8 10*3/uL (ref 4.0–10.5)

## 2014-10-20 LAB — POCT URINALYSIS DIP (DEVICE)
Bilirubin Urine: NEGATIVE
Glucose, UA: NEGATIVE mg/dL
Hgb urine dipstick: NEGATIVE
KETONES UR: NEGATIVE mg/dL
Leukocytes, UA: NEGATIVE
Nitrite: NEGATIVE
PROTEIN: NEGATIVE mg/dL
SPECIFIC GRAVITY, URINE: 1.02 (ref 1.005–1.030)
Urobilinogen, UA: 1 mg/dL (ref 0.0–1.0)
pH: 7.5 (ref 5.0–8.0)

## 2014-10-20 LAB — RPR

## 2014-10-20 LAB — GLUCOSE TOLERANCE, 1 HOUR (50G) W/O FASTING: GLUCOSE 1 HOUR GTT: 112 mg/dL (ref 70–140)

## 2014-10-20 MED ORDER — PRENATAL COMPLETE 14-0.4 MG PO TABS: 1.0000 | ORAL_TABLET | Freq: Every day | ORAL | Status: DC

## 2014-10-20 MED ORDER — TETANUS-DIPHTH-ACELL PERTUSSIS 5-2.5-18.5 LF-MCG/0.5 IM SUSP
0.5000 mL | Freq: Once | INTRAMUSCULAR | Status: AC
  Administered 2014-10-20: 0.5 mL via INTRAMUSCULAR

## 2014-10-20 MED ORDER — ASPIRIN EC 81 MG PO TBEC: 81.0000 mg | DELAYED_RELEASE_TABLET | Freq: Every day | ORAL | Status: DC

## 2014-10-20 NOTE — Progress Notes (Signed)
   Subjective:    Whitney Abbott is a [id number] 6175w5d being seen today for her first obstetrical visit.  Her obstetrical history is significant for pregnancy induced hypertension, pre-eclampsia and late onset to prenatal care and history of preterm delivery. Patient does intend to breast feed. Pregnancy history fully reviewed.  Patient reports no complaints.  Filed Vitals:   10/20/14 0856  BP: 129/92  Pulse: 112  Weight: 185 lb 1.6 oz (83.961 kg)    HISTORY: OB History  Gravida Para Term Preterm AB SAB TAB Ectopic Multiple Living  7 5 3 2 1  0 1 0 0 5    # Outcome Date GA Lbr Len/2nd Weight Sex Delivery Anes PTL Lv  7 Current           6 Term 05/24/13  00:36 / 00:10 7 lb 5.6 oz (3.334 kg) F Vag-Spont None  Y  5 Preterm 07/07/12 832w0d  4 lb 8 oz (2.041 kg)  Vag-Spont     4 Preterm 12/27/09 1022w0d  5 lb 10 oz (2.551 kg) F Vag-Spont     3 Term 02/23/08 738w0d  5 lb 8 oz (2.495 kg) F Vag-Spont     2 TAB 2009 2550w0d         1 Term 09/01/05 9238w0d  5 lb 9 oz (2.523 kg) M Vag-Spont        Past Medical History  Diagnosis Date  . Pregnancy induced hypertension    History reviewed. No pertinent past surgical history. Family History  Problem Relation Age of Onset  . Cancer Maternal Grandmother   . Hypertension Paternal Grandmother      Exam    Uterus:  Fundal Height: 31 cm  Pelvic Exam:    Perineum: Normal Perineum   Vulva: normal   Vagina:  normal mucosa, normal discharge   pH:    Cervix: closed and long   Adnexa: not evaluated   Bony Pelvis: gynecoid  System: Breast:  normal appearance, no masses or tenderness   Skin: normal coloration and turgor, no rashes    Neurologic: oriented, no focal deficits   Extremities: normal strength, tone, and muscle mass   HEENT extra ocular movement intact   Mouth/Teeth mucous membranes moist, pharynx normal without lesions and dental hygiene good   Neck supple and no masses   Cardiovascular: regular rate and rhythm   Respiratory:  chest  clear, no wheezing, crepitations, rhonchi, normal symmetric air entry   Abdomen: soft, gravid   Urinary:       Assessment:    Pregnancy: [id number] Patient Active Problem List   Diagnosis Date Noted  . Previous preterm delivery, antepartum 10/20/2014  . History of gestational hypertension 10/20/2014  . Supervision of high risk pregnancy in third trimester 10/20/2014  . Multigravida in third trimester 10/20/2014  . Limited prenatal care in third trimester 10/20/2014        Plan:     Initial labs drawn. Prenatal vitamins. Problem list reviewed and updated. Genetic Screening discussed : too late.  Ultrasound discussed; fetal survey: ordered.  Follow up in 2 weeks. 50% of 30 min visit spent on counseling and coordination of care.  Baseline labs ordered 2/2 h/o PIH Too late for 17-P Patient is undecided on contraception   Whitney Abbott 10/20/2014

## 2014-10-20 NOTE — Progress Notes (Signed)
Initial prenatal visit, some labs drawn at previous clinic but no prenatal visit 28 wk labs today Flu and Tdap vaccine. Educational information given. Pt needs refill on prenatal vitamin

## 2014-10-20 NOTE — Patient Instructions (Signed)
Breastfeeding Deciding to breastfeed is one of the best choices you can make for you and your baby. A change in hormones during pregnancy causes your breast tissue to grow and increases the number and size of your milk ducts. These hormones also allow proteins, sugars, and fats from your blood supply to make breast milk in your milk-producing glands. Hormones prevent breast milk from being released before your baby is born as well as prompt milk flow after birth. Once breastfeeding has begun, thoughts of your baby, as well as his or her sucking or crying, can stimulate the release of milk from your milk-producing glands.  BENEFITS OF BREASTFEEDING For Your Baby  Your first milk (colostrum) helps your baby's digestive system function better.   There are antibodies in your milk that help your baby fight off infections.   Your baby has a lower incidence of asthma, allergies, and sudden infant death syndrome.   The nutrients in breast milk are better for your baby than infant formulas and are designed uniquely for your baby's needs.   Breast milk improves your baby's brain development.   Your baby is less likely to develop other conditions, such as childhood obesity, asthma, or type 2 diabetes mellitus.  For You   Breastfeeding helps to create a very special bond between you and your baby.   Breastfeeding is convenient. Breast milk is always available at the correct temperature and costs nothing.   Breastfeeding helps to burn calories and helps you lose the weight gained during pregnancy.   Breastfeeding makes your uterus contract to its prepregnancy size faster and slows bleeding (lochia) after you give birth.   Breastfeeding helps to lower your risk of developing type 2 diabetes mellitus, osteoporosis, and breast or ovarian cancer later in life. SIGNS THAT YOUR BABY IS HUNGRY Early Signs of Hunger  Increased alertness or activity.  Stretching.  Movement of the head from  side to side.  Movement of the head and opening of the mouth when the corner of the mouth or cheek is stroked (rooting).  Increased sucking sounds, smacking lips, cooing, sighing, or squeaking.  Hand-to-mouth movements.  Increased sucking of fingers or hands. Late Signs of Hunger  Fussing.  Intermittent crying. Extreme Signs of Hunger Signs of extreme hunger will require calming and consoling before your baby will be able to breastfeed successfully. Do not wait for the following signs of extreme hunger to occur before you initiate breastfeeding:   Restlessness.  A loud, strong cry.   Screaming. BREASTFEEDING BASICS Breastfeeding Initiation  Find a comfortable place to sit or lie down, with your neck and back well supported.  Place a pillow or rolled up blanket under your baby to bring him or her to the level of your breast (if you are seated). Nursing pillows are specially designed to help support your arms and your baby while you breastfeed.  Make sure that your baby's abdomen is facing your abdomen.   Gently massage your breast. With your fingertips, massage from your chest wall toward your nipple in a circular motion. This encourages milk flow. You may need to continue this action during the feeding if your milk flows slowly.  Support your breast with 4 fingers underneath and your thumb above your nipple. Make sure your fingers are well away from your nipple and your baby's mouth.   Stroke your baby's lips gently with your finger or nipple.   When your baby's mouth is open wide enough, quickly bring your baby to your   breast, placing your entire nipple and as much of the colored area around your nipple (areola) as possible into your baby's mouth.   More areola should be visible above your baby's upper lip than below the lower lip.   Your baby's tongue should be between his or her lower gum and your breast.   Ensure that your baby's mouth is correctly positioned  around your nipple (latched). Your baby's lips should create a seal on your breast and be turned out (everted).  It is common for your baby to suck about 2-3 minutes in order to start the flow of breast milk. Latching Teaching your baby how to latch on to your breast properly is very important. An improper latch can cause nipple pain and decreased milk supply for you and poor weight gain in your baby. Also, if your baby is not latched onto your nipple properly, he or she may swallow some air during feeding. This can make your baby fussy. Burping your baby when you switch breasts during the feeding can help to get rid of the air. However, teaching your baby to latch on properly is still the best way to prevent fussiness from swallowing air while breastfeeding. Signs that your baby has successfully latched on to your nipple:    Silent tugging or silent sucking, without causing you pain.   Swallowing heard between every 3-4 sucks.    Muscle movement above and in front of his or her ears while sucking.  Signs that your baby has not successfully latched on to nipple:   Sucking sounds or smacking sounds from your baby while breastfeeding.  Nipple pain. If you think your baby has not latched on correctly, slip your finger into the corner of your baby's mouth to break the suction and place it between your baby's gums. Attempt breastfeeding initiation again. Signs of Successful Breastfeeding Signs from your baby:   A gradual decrease in the number of sucks or complete cessation of sucking.   Falling asleep.   Relaxation of his or her body.   Retention of a small amount of milk in his or her mouth.   Letting go of your breast by himself or herself. Signs from you:  Breasts that have increased in firmness, weight, and size 1-3 hours after feeding.   Breasts that are softer immediately after breastfeeding.  Increased milk volume, as well as a change in milk consistency and color by  the fifth day of breastfeeding.   Nipples that are not sore, cracked, or bleeding. Signs That Your Baby is Getting Enough Milk  Wetting at least 3 diapers in a 24-hour period. The urine should be clear and pale yellow by age 5 days.  At least 3 stools in a 24-hour period by age 5 days. The stool should be soft and yellow.  At least 3 stools in a 24-hour period by age 7 days. The stool should be seedy and yellow.  No loss of weight greater than 10% of birth weight during the first 3 days of age.  Average weight gain of 4-7 ounces (113-198 g) per week after age 4 days.  Consistent daily weight gain by age 5 days, without weight loss after the age of 2 weeks. After a feeding, your baby may spit up a small amount. This is common. BREASTFEEDING FREQUENCY AND DURATION Frequent feeding will help you make more milk and can prevent sore nipples and breast engorgement. Breastfeed when you feel the need to reduce the fullness of your breasts   or when your baby shows signs of hunger. This is called "breastfeeding on demand." Avoid introducing a pacifier to your baby while you are working to establish breastfeeding (the first 4-6 weeks after your baby is born). After this time you may choose to use a pacifier. Research has shown that pacifier use during the first year of a baby's life decreases the risk of sudden infant death syndrome (SIDS). Allow your baby to feed on each breast as long as he or she wants. Breastfeed until your baby is finished feeding. When your baby unlatches or falls asleep while feeding from the first breast, offer the second breast. Because newborns are often sleepy in the first few weeks of life, you may need to awaken your baby to get him or her to feed. Breastfeeding times will vary from baby to baby. However, the following rules can serve as a guide to help you ensure that your baby is properly fed:  Newborns (babies 4 weeks of age or younger) may breastfeed every 1-3  hours.  Newborns should not go longer than 3 hours during the day or 5 hours during the night without breastfeeding.  You should breastfeed your baby a minimum of 8 times in a 24-hour period until you begin to introduce solid foods to your baby at around 6 months of age. BREAST MILK PUMPING Pumping and storing breast milk allows you to ensure that your baby is exclusively fed your breast milk, even at times when you are unable to breastfeed. This is especially important if you are going back to work while you are still breastfeeding or when you are not able to be present during feedings. Your lactation consultant can give you guidelines on how long it is safe to store breast milk.  A breast pump is a machine that allows you to pump milk from your breast into a sterile bottle. The pumped breast milk can then be stored in a refrigerator or freezer. Some breast pumps are operated by hand, while others use electricity. Ask your lactation consultant which type will work best for you. Breast pumps can be purchased, but some hospitals and breastfeeding support groups lease breast pumps on a monthly basis. A lactation consultant can teach you how to hand express breast milk, if you prefer not to use a pump.  CARING FOR YOUR BREASTS WHILE YOU BREASTFEED Nipples can become dry, cracked, and sore while breastfeeding. The following recommendations can help keep your breasts moisturized and healthy:  Avoid using soap on your nipples.   Wear a supportive bra. Although not required, special nursing bras and tank tops are designed to allow access to your breasts for breastfeeding without taking off your entire bra or top. Avoid wearing underwire-style bras or extremely tight bras.  Air dry your nipples for 3-4minutes after each feeding.   Use only cotton bra pads to absorb leaked breast milk. Leaking of breast milk between feedings is normal.   Use lanolin on your nipples after breastfeeding. Lanolin helps to  maintain your skin's normal moisture barrier. If you use pure lanolin, you do not need to wash it off before feeding your baby again. Pure lanolin is not toxic to your baby. You may also hand express a few drops of breast milk and gently massage that milk into your nipples and allow the milk to air dry. In the first few weeks after giving birth, some women experience extremely full breasts (engorgement). Engorgement can make your breasts feel heavy, warm, and tender to the   touch. Engorgement peaks within 3-5 days after you give birth. The following recommendations can help ease engorgement:  Completely empty your breasts while breastfeeding or pumping. You may want to start by applying warm, moist heat (in the shower or with warm water-soaked hand towels) just before feeding or pumping. This increases circulation and helps the milk flow. If your baby does not completely empty your breasts while breastfeeding, pump any extra milk after he or she is finished.  Wear a snug bra (nursing or regular) or tank top for 1-2 days to signal your body to slightly decrease milk production.  Apply ice packs to your breasts, unless this is too uncomfortable for you.  Make sure that your baby is latched on and positioned properly while breastfeeding. If engorgement persists after 48 hours of following these recommendations, contact your health care provider or a Advertising copywriterlactation consultant. OVERALL HEALTH CARE RECOMMENDATIONS WHILE BREASTFEEDING  Eat healthy foods. Alternate between meals and snacks, eating 3 of each per day. Because what you eat affects your breast milk, some of the foods may make your baby more irritable than usual. Avoid eating these foods if you are sure that they are negatively affecting your baby.  Drink milk, fruit juice, and water to satisfy your thirst (about 10 glasses a day).   Rest often, relax, and continue to take your prenatal vitamins to prevent fatigue, stress, and anemia.  Continue  breast self-awareness checks.  Avoid chewing and smoking tobacco.  Avoid alcohol and drug use. Some medicines that may be harmful to your baby can pass through breast milk. It is important to ask your health care provider before taking any medicine, including all over-the-counter and prescription medicine as well as vitamin and herbal supplements. It is possible to become pregnant while breastfeeding. If birth control is desired, ask your health care provider about options that will be safe for your baby. SEEK MEDICAL CARE IF:   You feel like you want to stop breastfeeding or have become frustrated with breastfeeding.  You have painful breasts or nipples.  Your nipples are cracked or bleeding.  Your breasts are red, tender, or warm.  You have a swollen area on either breast.  You have a fever or chills.  You have nausea or vomiting.  You have drainage other than breast milk from your nipples.  Your breasts do not become full before feedings by the fifth day after you give birth.  You feel sad and depressed.  Your baby is too sleepy to eat well.  Your baby is having trouble sleeping.   Your baby is wetting less than 3 diapers in a 24-hour period.  Your baby has less than 3 stools in a 24-hour period.  Your baby's skin or the white part of his or her eyes becomes yellow.   Your baby is not gaining weight by 335 days of age. SEEK IMMEDIATE MEDICAL CARE IF:   Your baby is overly tired (lethargic) and does not want to wake up and feed.  Your baby develops an unexplained fever. Document Released: 11/14/2005 Document Revised: 11/19/2013 Document Reviewed: 05/08/2013 [facility] Patient Information 2015 [place]. This information is not intended to replace advice given to you by your health care provider. Make sure you discuss any questions you have with your health care provider. Contraception Choices Contraception (birth control) is the use of any methods or devices to  prevent pregnancy. Below are some methods to help avoid pregnancy. HORMONAL METHODS   Contraceptive implant. This is a thin,  plastic tube containing progesterone hormone. It does not contain estrogen hormone. Your health care provider inserts the tube in the inner part of the upper arm. The tube can remain in place for up to 3 years. After 3 years, the implant must be removed. The implant prevents the ovaries from releasing an egg (ovulation), thickens the cervical mucus to prevent sperm from entering the uterus, and thins the lining of the inside of the uterus.  Progesterone-only injections. These injections are given every 3 months by your health care provider to prevent pregnancy. This synthetic progesterone hormone stops the ovaries from releasing eggs. It also thickens cervical mucus and changes the uterine lining. This makes it harder for sperm to survive in the uterus.  Birth control pills. These pills contain estrogen and progesterone hormone. They work by preventing the ovaries from releasing eggs (ovulation). They also cause the cervical mucus to thicken, preventing the sperm from entering the uterus. Birth control pills are prescribed by a health care provider.Birth control pills can also be used to treat heavy periods.  Minipill. This type of birth control pill contains only the progesterone hormone. They are taken every day of each month and must be prescribed by your health care provider.  Birth control patch. The patch contains hormones similar to those in birth control pills. It must be changed once a week and is prescribed by a health care provider.  Vaginal ring. The ring contains hormones similar to those in birth control pills. It is left in the vagina for 3 weeks, removed for 1 week, and then a new one is put back in place. The patient must be comfortable inserting and removing the ring from the vagina.A health care provider's prescription is necessary.  Emergency contraception.  Emergency contraceptives prevent pregnancy after unprotected sexual intercourse. This pill can be taken right after sex or up to 5 days after unprotected sex. It is most effective the sooner you take the pills after having sexual intercourse. Most emergency contraceptive pills are available without a prescription. Check with your pharmacist. Do not use emergency contraception as your only form of birth control. BARRIER METHODS   Female condom. This is a thin sheath (latex or rubber) that is worn over the penis during sexual intercourse. It can be used with spermicide to increase effectiveness.  Female condom. This is a soft, loose-fitting sheath that is put into the vagina before sexual intercourse.  Diaphragm. This is a soft, latex, dome-shaped barrier that must be fitted by a health care provider. It is inserted into the vagina, along with a spermicidal jelly. It is inserted before intercourse. The diaphragm should be left in the vagina for 6 to 8 hours after intercourse.  Cervical cap. This is a round, soft, latex or plastic cup that fits over the cervix and must be fitted by a health care provider. The cap can be left in place for up to 48 hours after intercourse.  Sponge. This is a soft, circular piece of polyurethane foam. The sponge has spermicide in it. It is inserted into the vagina after wetting it and before sexual intercourse.  Spermicides. These are chemicals that kill or block sperm from entering the cervix and uterus. They come in the form of creams, jellies, suppositories, foam, or tablets. They do not require a prescription. They are inserted into the vagina with an applicator before having sexual intercourse. The process must be repeated every time you have sexual intercourse. INTRAUTERINE CONTRACEPTION  Intrauterine device (IUD). This is a  T-shaped device that is put in a woman's uterus during a menstrual period to prevent pregnancy. There are 2 types:  Copper IUD. This type of IUD  is wrapped in copper wire and is placed inside the uterus. Copper makes the uterus and fallopian tubes produce a fluid that kills sperm. It can stay in place for 10 years.  Hormone IUD. This type of IUD contains the hormone progestin (synthetic progesterone). The hormone thickens the cervical mucus and prevents sperm from entering the uterus, and it also thins the uterine lining to prevent implantation of a fertilized egg. The hormone can weaken or kill the sperm that get into the uterus. It can stay in place for 3-5 years, depending on which type of IUD is used. PERMANENT METHODS OF CONTRACEPTION  Female tubal ligation. This is when the woman's fallopian tubes are surgically sealed, tied, or blocked to prevent the egg from traveling to the uterus.  Hysteroscopic sterilization. This involves placing a small coil or insert into each fallopian tube. Your doctor uses a technique called hysteroscopy to do the procedure. The device causes scar tissue to form. This results in permanent blockage of the fallopian tubes, so the sperm cannot fertilize the egg. It takes about 3 months after the procedure for the tubes to become blocked. You must use another form of birth control for these 3 months.  Female sterilization. This is when the female has the tubes that carry sperm tied off (vasectomy).This blocks sperm from entering the vagina during sexual intercourse. After the procedure, the man can still ejaculate fluid (semen). NATURAL PLANNING METHODS  Natural family planning. This is not having sexual intercourse or using a barrier method (condom, diaphragm, cervical cap) on days the woman could become pregnant.  Calendar method. This is keeping track of the length of each menstrual cycle and identifying when you are fertile.  Ovulation method. This is avoiding sexual intercourse during ovulation.  Symptothermal method. This is avoiding sexual intercourse during ovulation, using a thermometer and ovulation  symptoms.  Post-ovulation method. This is timing sexual intercourse after you have ovulated. Regardless of which type or method of contraception you choose, it is important that you use condoms to protect against the transmission of sexually transmitted infections (STIs). Talk with your health care provider about which form of contraception is most appropriate for you. Document Released: 11/14/2005 Document Revised: 11/19/2013 Document Reviewed: 05/09/2013 [facility] Patient Information 2015 [place], [place]. This information is not intended to replace advice given to you by your health care provider. Make sure you discuss any questions you have with your health care provider.

## 2014-10-20 NOTE — Progress Notes (Signed)
Anatomy U/S with Radiology 10/22/14 @ 830a.

## 2014-10-21 LAB — CYTOLOGY - PAP

## 2014-10-21 LAB — CULTURE, OB URINE
COLONY COUNT: NO GROWTH
ORGANISM ID, BACTERIA: NO GROWTH

## 2014-10-22 ENCOUNTER — Ambulatory Visit (HOSPITAL_COMMUNITY)
Admission: RE | Admit: 2014-10-22 | Discharge: 2014-10-22 | Disposition: A | Discharge status: Home / Self Care | Payer: Medicaid Other | Source: Ambulatory Visit | Attending: Obstetrics and Gynecology | Admitting: Obstetrics and Gynecology

## 2014-10-22 DIAGNOSIS — O0993 Supervision of high risk pregnancy, unspecified, third trimester: Secondary | ICD-10-CM

## 2014-10-22 DIAGNOSIS — Z3A28 28 weeks gestation of pregnancy: Secondary | ICD-10-CM | POA: Insufficient documentation

## 2014-10-22 DIAGNOSIS — O09292 Supervision of pregnancy with other poor reproductive or obstetric history, second trimester: Secondary | ICD-10-CM | POA: Insufficient documentation

## 2014-10-24 LAB — CANNABANOIDS (GC/LC/MS), URINE: THC-COOH (GC/LC/MS), ur confirm: 519 ng/mL — AB (ref <5)

## 2014-10-25 LAB — PRESCRIPTION MONITORING PROFILE (19 PANEL)
Amphetamine/Meth: NEGATIVE ng/mL
Barbiturate Screen, Urine: NEGATIVE ng/mL
Benzodiazepine Screen, Urine: NEGATIVE ng/mL
Buprenorphine, Urine: NEGATIVE ng/mL
Carisoprodol, Urine: NEGATIVE ng/mL
Cocaine Metabolites: NEGATIVE ng/mL
Creatinine, Urine: 124.6 mg/dL (ref >20.0)
Fentanyl, Ur: NEGATIVE ng/mL
MDMA URINE: NEGATIVE ng/mL
Meperidine, Ur: NEGATIVE ng/mL
Methadone Screen, Urine: NEGATIVE ng/mL
Methaqualone: NEGATIVE ng/mL
Nitrites, Initial: NEGATIVE ug/mL
Opiate Screen, Urine: NEGATIVE ng/mL
Oxycodone Screen, Ur: NEGATIVE ng/mL
Phencyclidine, Ur: NEGATIVE ng/mL
Propoxyphene: NEGATIVE ng/mL
Tapentadol, urine: NEGATIVE ng/mL
Tramadol Scrn, Ur: NEGATIVE ng/mL
Zolpidem, Urine: NEGATIVE ng/mL
pH, Initial: 8 pH (ref 4.5–8.9)

## 2014-10-31 ENCOUNTER — Telehealth: Payer: Self-pay | Admitting: General Practice

## 2014-10-31 NOTE — Telephone Encounter (Signed)
Patient called and left message stating she is 7 months pregnant and keeps having pains in both her legs which is making it difficult to walk, doesn't know if she needs to come in to the ER or see a doctor, please call back and advise. Called patient and asked where her pain was coming from and she states in her thigh crease and they told her she would have pain there since she has had so many babies. Asked patient if she has had anything for the pain and she states know. Recommended to patient she try tylenol, heating pad or muscle rub. Patient verbalized understanding and had no other questions

## 2014-11-03 ENCOUNTER — Encounter: Payer: Self-pay | Admitting: Obstetrics and Gynecology

## 2014-11-03 ENCOUNTER — Ambulatory Visit (INDEPENDENT_AMBULATORY_CARE_PROVIDER_SITE_OTHER): Payer: Medicaid Other | Admitting: Obstetrics and Gynecology

## 2014-11-03 VITALS — BP 131/79 | HR 87 | Temp 98.4°F | Wt 188.0 lb

## 2014-11-03 DIAGNOSIS — O0933 Supervision of pregnancy with insufficient antenatal care, third trimester: Secondary | ICD-10-CM

## 2014-11-03 DIAGNOSIS — Z8759 Personal history of other complications of pregnancy, childbirth and the puerperium: Secondary | ICD-10-CM

## 2014-11-03 DIAGNOSIS — O0993 Supervision of high risk pregnancy, unspecified, third trimester: Secondary | ICD-10-CM

## 2014-11-03 DIAGNOSIS — O09213 Supervision of pregnancy with history of pre-term labor, third trimester: Secondary | ICD-10-CM

## 2014-11-03 DIAGNOSIS — Z3483 Encounter for supervision of other normal pregnancy, third trimester: Secondary | ICD-10-CM

## 2014-11-03 LAB — POCT URINALYSIS DIP (DEVICE)
Bilirubin Urine: NEGATIVE
Glucose, UA: NEGATIVE mg/dL
HGB URINE DIPSTICK: NEGATIVE
Ketones, ur: NEGATIVE mg/dL
NITRITE: NEGATIVE
PH: 7 (ref 5.0–8.0)
Protein, ur: NEGATIVE mg/dL
Specific Gravity, Urine: 1.015 (ref 1.005–1.030)
UROBILINOGEN UA: 1 mg/dL (ref 0.0–1.0)

## 2014-11-03 NOTE — Progress Notes (Signed)
Patient is doing well. She reports pelvic pressure after periods of prolonged standing. She denies cramping/contractions. Reassurance provided. Patient remains undecided regarding contraception. FM/PTL precautions reviewed. Continue daily aspirin

## 2014-11-03 NOTE — Progress Notes (Signed)
Pt C/o pressure in lower abdomen and cramping in legs. Pt states she had called the clinic and was told to use heating pads.

## 2014-11-14 ENCOUNTER — Inpatient Hospital Stay (HOSPITAL_COMMUNITY)
Admission: AD | Admit: 2014-11-14 | Discharge: 2014-11-14 | Disposition: A | Discharge status: Home / Self Care | Payer: Medicaid Other | Source: Ambulatory Visit | Attending: Obstetrics & Gynecology | Admitting: Obstetrics & Gynecology

## 2014-11-14 ENCOUNTER — Encounter (HOSPITAL_COMMUNITY): Payer: Self-pay | Admitting: *Deleted

## 2014-11-14 DIAGNOSIS — O99013 Anemia complicating pregnancy, third trimester: Secondary | ICD-10-CM | POA: Diagnosis not present

## 2014-11-14 DIAGNOSIS — R0600 Dyspnea, unspecified: Secondary | ICD-10-CM

## 2014-11-14 DIAGNOSIS — R51 Headache: Secondary | ICD-10-CM | POA: Insufficient documentation

## 2014-11-14 DIAGNOSIS — O9989 Other specified diseases and conditions complicating pregnancy, childbirth and the puerperium: Secondary | ICD-10-CM | POA: Diagnosis not present

## 2014-11-14 DIAGNOSIS — Z3A31 31 weeks gestation of pregnancy: Secondary | ICD-10-CM | POA: Insufficient documentation

## 2014-11-14 DIAGNOSIS — D649 Anemia, unspecified: Secondary | ICD-10-CM | POA: Insufficient documentation

## 2014-11-14 DIAGNOSIS — R0602 Shortness of breath: Secondary | ICD-10-CM | POA: Insufficient documentation

## 2014-11-14 LAB — COMPREHENSIVE METABOLIC PANEL
ALBUMIN: 2.7 g/dL — AB (ref 3.5–5.2)
ALT: 5 U/L (ref 0–35)
AST: 13 U/L (ref 0–37)
Alkaline Phosphatase: 140 U/L — ABNORMAL HIGH (ref 39–117)
Anion gap: 14 (ref 5–15)
BUN: 6 mg/dL (ref 6–23)
CO2: 21 meq/L (ref 19–32)
CREATININE: 0.57 mg/dL (ref 0.50–1.10)
Calcium: 9.1 mg/dL (ref 8.4–10.5)
Chloride: 100 mEq/L (ref 96–112)
GFR calc Af Amer: >90 mL/min (ref >90)
GFR calc non Af Amer: >90 mL/min (ref >90)
Glucose, Bld: 112 mg/dL — ABNORMAL HIGH (ref 70–99)
Potassium: 3.7 mEq/L (ref 3.7–5.3)
SODIUM: 135 meq/L — AB (ref 137–147)
TOTAL PROTEIN: 7.3 g/dL (ref 6.0–8.3)
Total Bilirubin: 0.3 mg/dL (ref 0.3–1.2)

## 2014-11-14 LAB — PROTEIN / CREATININE RATIO, URINE
Creatinine, Urine: 380.29 mg/dL
PROTEIN CREATININE RATIO: 0.16 — AB (ref 0.00–0.15)
Total Protein, Urine: 62.5 mg/dL

## 2014-11-14 LAB — URINALYSIS, ROUTINE W REFLEX MICROSCOPIC
Bilirubin Urine: NEGATIVE
Glucose, UA: NEGATIVE mg/dL
Hgb urine dipstick: NEGATIVE
Ketones, ur: 15 mg/dL — AB
Leukocytes, UA: NEGATIVE
NITRITE: NEGATIVE
Protein, ur: 30 mg/dL — AB
SPECIFIC GRAVITY, URINE: 1.025 (ref 1.005–1.030)
UROBILINOGEN UA: 1 mg/dL (ref 0.0–1.0)
pH: 6.5 (ref 5.0–8.0)

## 2014-11-14 LAB — URINE MICROSCOPIC-ADD ON

## 2014-11-14 LAB — CBC WITH DIFFERENTIAL/PLATELET
Basophils Absolute: 0 10*3/uL (ref 0.0–0.1)
Basophils Relative: 0 % (ref 0–1)
Eosinophils Absolute: 0.1 10*3/uL (ref 0.0–0.7)
Eosinophils Relative: 1 % (ref 0–5)
HCT: 31.9 % — ABNORMAL LOW (ref 36.0–46.0)
Hemoglobin: 10.1 g/dL — ABNORMAL LOW (ref 12.0–15.0)
Lymphocytes Relative: 18 % (ref 12–46)
Lymphs Abs: 2.2 10*3/uL (ref 0.7–4.0)
MCH: 23.9 pg — ABNORMAL LOW (ref 26.0–34.0)
MCHC: 31.7 g/dL (ref 30.0–36.0)
MCV: 75.6 fL — ABNORMAL LOW (ref 78.0–100.0)
Monocytes Absolute: 0.5 10*3/uL (ref 0.1–1.0)
Monocytes Relative: 5 % (ref 3–12)
Neutro Abs: 9.1 10*3/uL — ABNORMAL HIGH (ref 1.7–7.7)
Neutrophils Relative %: 76 % (ref 43–77)
Platelets: 254 10*3/uL (ref 150–400)
RBC: 4.22 MIL/uL (ref 3.87–5.11)
RDW: 15.4 % (ref 11.5–15.5)
WBC: 12 10*3/uL — ABNORMAL HIGH (ref 4.0–10.5)

## 2014-11-14 MED ORDER — LACTATED RINGERS IV BOLUS (SEPSIS): 1000.0000 mL | Freq: Once | INTRAVENOUS | Status: DC

## 2014-11-14 NOTE — MAU Provider Note (Signed)
History     CSN: [id number]  Arrival date and time: 11/14/14 1610   None     Chief Complaint  Patient presents with  . Shortness of Breath  . Headache   HPI  Pt is 28 y.o. [id number] 2478w2d w/ history of gHTN, but has never had pre-eclampsia, and anemia of pregnancy who presents with headache and SOB.  SOB started earlier today, pt states she just feels like she can't catch her breath especially when lying down.  Headache is mild in severity and has come and gone.    Past Medical History  Diagnosis Date  . Pregnancy induced hypertension     Past Surgical History  Procedure Laterality Date  . No past surgeries      Family History  Problem Relation Age of Onset  . Cancer Maternal Grandmother   . Hypertension Paternal Grandmother     History  Substance Use Topics  . Smoking status: Never Smoker   . Smokeless tobacco: Never Used  . Alcohol Use: No    Allergies: No Known Allergies  Prescriptions prior to admission  Medication Sig Dispense Refill Last Dose  . aspirin EC 81 MG tablet Take 1 tablet (81 mg total) by mouth daily. 30 tablet 8 Past Week at Unknown time  . Prenatal Vit-Fe Fumarate-FA (PRENATAL COMPLETE) 14-0.4 MG TABS Take 1 tablet by mouth daily. 60 each 1 11/13/2014 at Unknown time    Review of Systems  Constitutional: Negative for fever and chills.  Respiratory: Positive for shortness of breath.   Cardiovascular: Positive for orthopnea. Negative for chest pain and palpitations.  Gastrointestinal: Positive for nausea and vomiting. Negative for abdominal pain.  Genitourinary: Negative for dysuria.  Musculoskeletal: Negative for back pain.  Neurological: Negative for dizziness.  Endo/Heme/Allergies: Negative.   Psychiatric/Behavioral: Negative for depression.   Physical Exam   Blood pressure 126/66, pulse 114, temperature 97.8 F (36.6 C), temperature source Oral, resp. rate 20, last menstrual period 04/09/2014, SpO2 99 %, unknown if currently  breastfeeding.  Physical Exam  Constitutional: She is oriented to person, place, and time. She appears well-developed and well-nourished.  HENT:  Head: Normocephalic and atraumatic.  Eyes: Conjunctivae are normal. Pupils are equal, round, and reactive to light.  Neck: Normal range of motion. Neck supple.  Cardiovascular:  Tachycardic 99-130  Respiratory:  Mildly labored breathing   Musculoskeletal: Normal range of motion.  Neurological: She is alert and oriented to person, place, and time. She has normal reflexes.  Skin: Skin is warm and dry.  Psychiatric: She has a normal mood and affect.    MAU Course  Procedures  MDM CMP, CBC, Pr/Cr ratio   Assessment and Plan  Pt is a 28 y.o. [id number] 2378w2d who present with SOB and headache.  Pt has normal O2 saturation, CMP, CBC, pr/cr ratio is 0.16 and not consistent with pre-eclampsia.    #SOB -Likely normal changes of pregnancy with second trimester -No evdidence of PE with normal O2 saturation  -Hgb still low, but better than several weeks ago, so unlikely the cause with such an acute onset   #Headache R/O preeclampsia: CBC, CMP, Pr/Cr all WNL  #Anemia Pt to f/u w/ OB at next appt, may need further Fe supplementation    Benjamin Stainhompson, McKenzie L, MD  11/14/2014, 4:53 PM   Filed Vitals:   11/14/14 1717 11/14/14 1806 11/14/14 1817 11/14/14 1831  BP: 127/73 111/70 124/77 124/77  Pulse: 99 127 88 88  Temp:    97.9 F (36.6 C)  TempSrc:    Oral  Resp:    18  SpO2:      Evaluation and management procedures were performed by Resident physician under my supervision/collaboration. Chart reviewed, patient examined by me and I agree with management and plan.

## 2014-11-14 NOTE — MAU Note (Signed)
SOB started in the last 2 hours, has HA, hx of gestational HTN.  Also feeling irregular uc's, denies bleeding or LOF.

## 2014-11-14 NOTE — Discharge Instructions (Signed)
Second Trimester of Pregnancy °The second trimester is from week 13 through week 28, months 4 through 6. The second trimester is often a time when you feel your best. Your body has also adjusted to being pregnant, and you begin to feel better physically. Usually, morning sickness has lessened or quit completely, you may have more energy, and you may have an increase in appetite. The second trimester is also a time when the fetus is growing rapidly. At the end of the sixth month, the fetus is about 9 inches long and weighs about 1½ pounds. You will likely begin to feel the baby move (quickening) between 18 and 20 weeks of the pregnancy. °BODY CHANGES °Your body goes through many changes during pregnancy. The changes vary from woman to woman.  °· Your weight will continue to increase. You will notice your lower abdomen bulging out. °· You may begin to get stretch marks on your hips, abdomen, and breasts. °· You may develop headaches that can be relieved by medicines approved by your health care provider. °· You may urinate more often because the fetus is pressing on your bladder. °· You may develop or continue to have heartburn as a result of your pregnancy. °· You may develop constipation because certain hormones are causing the muscles that push waste through your intestines to slow down. °· You may develop hemorrhoids or swollen, bulging veins (varicose veins). °· You may have back pain because of the weight gain and pregnancy hormones relaxing your joints between the bones in your pelvis and as a result of a shift in weight and the muscles that support your balance. °· Your breasts will continue to grow and be tender. °· Your gums may bleed and may be sensitive to brushing and flossing. °· Dark spots or blotches (chloasma, mask of pregnancy) may develop on your face. This will likely fade after the baby is born. °· A dark line from your belly button to the pubic area (linea nigra) may appear. This will likely fade  after the baby is born. °· You may have changes in your hair. These can include thickening of your hair, rapid growth, and changes in texture. Some women also have hair loss during or after pregnancy, or hair that feels dry or thin. Your hair will most likely return to normal after your baby is born. °WHAT TO EXPECT AT YOUR PRENATAL VISITS °During a routine prenatal visit: °· You will be weighed to make sure you and the fetus are growing normally. °· Your blood pressure will be taken. °· Your abdomen will be measured to track your baby's growth. °· The fetal heartbeat will be listened to. °· Any test results from the previous visit will be discussed. °Your health care provider may ask you: °· How you are feeling. °· If you are feeling the baby move. °· If you have had any abnormal symptoms, such as leaking fluid, bleeding, severe headaches, or abdominal cramping. °· If you have any questions. °Other tests that may be performed during your second trimester include: °· Blood tests that check for: °¨ Low iron levels (anemia). °¨ Gestational diabetes (between 24 and 28 weeks). °¨ Rh antibodies. °· Urine tests to check for infections, diabetes, or protein in the urine. °· An ultrasound to confirm the proper growth and development of the baby. °· An amniocentesis to check for possible genetic problems. °· Fetal screens for spina bifida and Down syndrome. °HOME CARE INSTRUCTIONS  °· Avoid all smoking, herbs, alcohol, and unprescribed   drugs. These chemicals affect the formation and growth of the baby.  Follow your health care provider's instructions regarding medicine use. There are medicines that are either safe or unsafe to take during pregnancy.  Exercise only as directed by your health care provider. Experiencing uterine cramps is a good sign to stop exercising.  Continue to eat regular, healthy meals.  Wear a good support bra for breast tenderness.  Do not use hot tubs, steam rooms, or saunas.  Wear your  seat belt at all times when driving.  Avoid raw meat, uncooked cheese, cat litter boxes, and soil used by cats. These carry germs that can cause birth defects in the baby.  Take your prenatal vitamins.  Try taking a stool softener (if your health care provider approves) if you develop constipation. Eat more high-fiber foods, such as fresh vegetables or fruit and whole grains. Drink plenty of fluids to keep your urine clear or pale yellow.  Take warm sitz baths to soothe any pain or discomfort caused by hemorrhoids. Use hemorrhoid cream if your health care provider approves.  If you develop varicose veins, wear support hose. Elevate your feet for 15 minutes, 3-4 times a day. Limit salt in your diet.  Avoid heavy lifting, wear low heel shoes, and practice good posture.  Rest with your legs elevated if you have leg cramps or low back pain.  Visit your dentist if you have not gone yet during your pregnancy. Use a soft toothbrush to brush your teeth and be gentle when you floss.  A sexual relationship may be continued unless your health care provider directs you otherwise.  Continue to go to all your prenatal visits as directed by your health care provider. SEEK MEDICAL CARE IF:   You have dizziness.  You have mild pelvic cramps, pelvic pressure, or nagging pain in the abdominal area.  You have persistent nausea, vomiting, or diarrhea.  You have a bad smelling vaginal discharge.  You have pain with urination. SEEK IMMEDIATE MEDICAL CARE IF:   You have a fever.  You are leaking fluid from your vagina.  You have spotting or bleeding from your vagina.  You have severe abdominal cramping or pain.  You have rapid weight gain or loss.  You have shortness of breath with chest pain.  You notice sudden or extreme swelling of your face, hands, ankles, feet, or legs.  You have not felt your baby move in over an hour.  You have severe headaches that do not go away with  medicine.  You have vision changes. Document Released: 11/08/2001 Document Revised: 11/19/2013 Document Reviewed: 01/15/2013 [facility] Patient Information 2015 [place], [place]. This information is not intended to replace advice given to you by your health care provider. Make sure you discuss any questions you have with your health care provider. Pregnancy and Anemia Anemia is a condition in which the concentration of red blood cells or hemoglobin in the blood is below normal. Hemoglobin is a substance in red blood cells that carries oxygen to the tissues of the body. Anemia results in not enough oxygen reaching these tissues.  Anemia during pregnancy is common because the fetus uses more iron and folic acid as it is developing. Your body may not produce enough red blood cells because of this. Also, during pregnancy, the liquid part of the blood (plasma) increases by about 50%, and the red blood cells increase by only 25%. This lowers the concentration of the red blood cells and creates a natural anemia-like situation.  CAUSES  The most common cause of anemia during pregnancy is not having enough iron in the body to make red blood cells (iron deficiency anemia). Other causes may include:  Folic acid deficiency.  Vitamin B12 deficiency.  Certain prescription or over-the-counter medicines.  Certain medical conditions or infections that destroy red blood cells.  A low platelet count and bleeding caused by antibodies that go through the placenta to the fetus from the mother's blood. SIGNS AND SYMPTOMS  Mild anemia may not be noticeable. If it becomes severe, symptoms may include:  Tiredness.  Shortness of breath, especially with exercise.  Weakness.  Fainting.  Pale looking skin.  Headaches.  Feeling a fast or irregular heartbeat (palpitations). DIAGNOSIS  The type of anemia is usually diagnosed from your family and medical history and blood tests. TREATMENT  Treatment of anemia  during pregnancy depends on the cause of the anemia. Treatment can include:  Supplements of iron, vitamin B12, or folic acid.  A blood transfusion. This may be needed if blood loss is severe.  Hospitalization. This may be needed if there is significant continual blood loss.  Dietary changes. HOME CARE INSTRUCTIONS   Follow your dietitian's or health care provider's dietary recommendations.  Increase your vitamin C intake. This will help the stomach absorb more iron.  Eat a diet rich in iron. This would include foods such as:  Liver.  Beef.  Whole grain bread.  Eggs.  Dried fruit.  Take iron and vitamins as directed by your health care provider.  Eat green leafy vegetables. These are a good source of folic acid. SEEK MEDICAL CARE IF:   You have frequent or lasting headaches.  You are looking pale.  You are bruising easily. SEEK IMMEDIATE MEDICAL CARE IF:   You have extreme weakness, shortness of breath, or chest pain.  You become dizzy or have trouble concentrating.  You have heavy vaginal bleeding.  You develop a rash.  You have bloody or black, tarry stools.  You faint.  You vomit up blood.  You vomit repeatedly.  You have abdominal pain.  You have a fever or persistent symptoms for more than 2-3 days.  You have a fever and your symptoms suddenly get worse.  You are dehydrated. MAKE SURE YOU:   Understand these instructions.  Will watch your condition.  Will get help right away if you are not doing well or get worse. Document Released: 11/11/2000 Document Revised: 09/04/2013 Document Reviewed: 06/26/2013 [facility] SiteExitCare Patient Information 2015 [place]Care, [place]LL[place]. This information is not intended to replace advice given to you by your health care provider. Make sure you discuss any questions you have with your health care provider.

## 2014-11-17 ENCOUNTER — Encounter: Payer: Medicaid Other | Admitting: Obstetrics and Gynecology

## 2014-11-24 ENCOUNTER — Encounter: Payer: Self-pay | Admitting: Family Medicine

## 2014-11-24 ENCOUNTER — Ambulatory Visit (INDEPENDENT_AMBULATORY_CARE_PROVIDER_SITE_OTHER): Payer: Medicaid Other | Admitting: Family Medicine

## 2014-11-24 VITALS — BP 107/68 | HR 97 | Temp 97.5°F | Wt 187.5 lb

## 2014-11-24 DIAGNOSIS — O139 Gestational [pregnancy-induced] hypertension without significant proteinuria, unspecified trimester: Secondary | ICD-10-CM

## 2014-11-24 DIAGNOSIS — O09219 Supervision of pregnancy with history of pre-term labor, unspecified trimester: Secondary | ICD-10-CM

## 2014-11-24 DIAGNOSIS — O0993 Supervision of high risk pregnancy, unspecified, third trimester: Secondary | ICD-10-CM

## 2014-11-24 LAB — POCT URINALYSIS DIP (DEVICE)
BILIRUBIN URINE: NEGATIVE
Glucose, UA: NEGATIVE mg/dL
HGB URINE DIPSTICK: NEGATIVE
Ketones, ur: NEGATIVE mg/dL
Leukocytes, UA: NEGATIVE
Nitrite: NEGATIVE
PH: 7 (ref 5.0–8.0)
Protein, ur: 30 mg/dL — AB
Specific Gravity, Urine: 1.015 (ref 1.005–1.030)
Urobilinogen, UA: 4 mg/dL — ABNORMAL HIGH (ref 0.0–1.0)

## 2014-11-24 NOTE — Progress Notes (Signed)
Doing well BP looks good F/u in 2 wks

## 2014-11-24 NOTE — Patient Instructions (Signed)
Preeclampsia and Eclampsia Preeclampsia is a serious condition that develops only during pregnancy. It is also called toxemia of pregnancy. This condition causes high blood pressure along with other symptoms, such as swelling and headaches. These may develop as the condition gets worse. Preeclampsia may occur 20 weeks or later into your pregnancy.  Diagnosing and treating preeclampsia early is very important. If not treated early, it can cause serious problems for you and your baby. One problem it can lead to is eclampsia, which is a condition that causes muscle jerking or shaking (convulsions) in the mother. Delivering your baby is the best treatment for preeclampsia or eclampsia.  RISK FACTORS The cause of preeclampsia is not known. You may be more likely to develop preeclampsia if you have certain risk factors. These include:   Being pregnant for the first time.  Having preeclampsia in a past pregnancy.  Having a family history of preeclampsia.  Having high blood pressure.  Being pregnant with twins or triplets.  Being 35 or older.  Being African American.  Having kidney disease or diabetes.  Having medical conditions such as lupus or blood diseases.  Being very overweight (obese). SIGNS AND SYMPTOMS  The earliest signs of preeclampsia are:  High blood pressure.  Increased protein in your urine. Your health care provider will check for this at every prenatal visit. Other symptoms that can develop include:   Severe headaches.  Sudden weight gain.  Swelling of your hands, face, legs, and feet.  Feeling sick to your stomach (nauseous) and throwing up (vomiting).  Vision problems (blurred or double vision).  Numbness in your face, arms, legs, and feet.  Dizziness.  Slurred speech.  Sensitivity to bright lights.  Abdominal pain. DIAGNOSIS  There are no screening tests for preeclampsia. Your health care provider will ask you about symptoms and check for signs of  preeclampsia during your prenatal visits. You may also have tests, including:  Urine testing.  Blood testing.  Checking your baby's heart rate.  Checking the health of your baby and your placenta using images created with sound waves (ultrasound). TREATMENT  You can work out the best treatment approach together with your health care provider. It is very important to keep all prenatal appointments. If you have an increased risk of preeclampsia, you may need more frequent prenatal exams.  Your health care provider may prescribe bed rest.  You may have to eat as little salt as possible.  You may need to take medicine to lower your blood pressure if the condition does not respond to more conservative measures.  You may need to stay in the hospital if your condition is severe. There, treatment will focus on controlling your blood pressure and fluid retention. You may also need to take medicine to prevent seizures.  If the condition gets worse, your baby may need to be delivered early to protect you and the baby. You may have your labor started with medicine (be induced), or you may have a cesarean delivery.  Preeclampsia usually goes away after the baby is born. HOME CARE INSTRUCTIONS   Only take over-the-counter or prescription medicines as directed by your health care provider.  Lie on your left side while resting. This keeps pressure off your baby.  Elevate your feet while resting.  Get regular exercise. Ask your health care provider what type of exercise is safe for you.  Avoid caffeine and alcohol.  Do not smoke.  Drink 6-8 glasses of water every day.  Eat a balanced diet   that is low in salt. Do not add salt to your food.  Avoid stressful situations as much as possible.  Get plenty of rest and sleep.  Keep all prenatal appointments and tests as scheduled. SEEK MEDICAL CARE IF:  You are gaining more weight than expected.  You have any headaches, abdominal pain, or  nausea.  You are bruising more than usual.  You feel dizzy or light-headed. SEEK IMMEDIATE MEDICAL CARE IF:   You develop sudden or severe swelling anywhere in your body. This usually happens in the legs.  You gain 5 lb (2.3 kg) or more in a week.  You have a severe headache, dizziness, problems with your vision, or confusion.  You have severe abdominal pain.  You have lasting nausea or vomiting.  You have a seizure.  You have trouble moving any part of your body.  You develop numbness in your body.  You have trouble speaking.  You have any abnormal bleeding.  You develop a stiff neck.  You pass out. MAKE SURE YOU:   Understand these instructions.  Will watch your condition.  Will get help right away if you are not doing well or get worse. Document Released: 11/11/2000 Document Revised: 11/19/2013 Document Reviewed: 09/06/2013 [employer] Patient Information 2015 [employer]. This information is not intended to replace advice given to you by your health care provider. Make sure you discuss any questions you have with your health care provider.  Third Trimester of Pregnancy The third trimester is from week 29 through week 42, months 7 through 9. The third trimester is a time when the fetus is growing rapidly. At the end of the ninth month, the fetus is about 20 inches in length and weighs 6-10 pounds.  BODY CHANGES Your body goes through many changes during pregnancy. The changes vary from woman to woman.   Your weight will continue to increase. You can expect to gain 25-35 pounds (11-16 kg) by the end of the pregnancy.  You may begin to get stretch marks on your hips, abdomen, and breasts.  You may urinate more often because the fetus is moving lower into your pelvis and pressing on your bladder.  You may develop or continue to have heartburn as a result of your pregnancy.  You may develop constipation because certain hormones are causing the muscles that push  waste through your intestines to slow down.  You may develop hemorrhoids or swollen, bulging veins (varicose veins).  You may have pelvic pain because of the weight gain and pregnancy hormones relaxing your joints between the bones in your pelvis. Backaches may result from overexertion of the muscles supporting your posture.  You may have changes in your hair. These can include thickening of your hair, rapid growth, and changes in texture. Some women also have hair loss during or after pregnancy, or hair that feels dry or thin. Your hair will most likely return to normal after your baby is born.  Your breasts will continue to grow and be tender. A yellow discharge may leak from your breasts called colostrum.  Your belly button may stick out.  You may feel short of breath because of your expanding uterus.  You may notice the fetus "dropping," or moving lower in your abdomen.  You may have a bloody mucus discharge. This usually occurs a few days to a week before labor begins.  Your cervix becomes thin and soft (effaced) near your due date. WHAT TO EXPECT AT YOUR PRENATAL EXAMS  You will have   prenatal exams every 2 weeks until week 36. Then, you will have weekly prenatal exams. During a routine prenatal visit:  You will be weighed to make sure you and the fetus are growing normally.  Your blood pressure is taken.  Your abdomen will be measured to track your baby's growth.  The fetal heartbeat will be listened to.  Any test results from the previous visit will be discussed.  You may have a cervical check near your due date to see if you have effaced. At around 36 weeks, your caregiver will check your cervix. At the same time, your caregiver will also perform a test on the secretions of the vaginal tissue. This test is to determine if a type of bacteria, Group B streptococcus, is present. Your caregiver will explain this further. Your caregiver may ask you:  What your birth plan  is.  How you are feeling.  If you are feeling the baby move.  If you have had any abnormal symptoms, such as leaking fluid, bleeding, severe headaches, or abdominal cramping.  If you have any questions. Other tests or screenings that may be performed during your third trimester include:  Blood tests that check for low iron levels (anemia).  Fetal testing to check the health, activity level, and growth of the fetus. Testing is done if you have certain medical conditions or if there are problems during the pregnancy. FALSE LABOR You may feel small, irregular contractions that eventually go away. These are called Braxton Hicks contractions, or false labor. Contractions may last for hours, days, or even weeks before true labor sets in. If contractions come at regular intervals, intensify, or become painful, it is best to be seen by your caregiver.  SIGNS OF LABOR   Menstrual-like cramps.  Contractions that are 5 minutes apart or less.  Contractions that start on the top of the uterus and spread down to the lower abdomen and back.  A sense of increased pelvic pressure or back pain.  A watery or bloody mucus discharge that comes from the vagina. If you have any of these signs before the 37th week of pregnancy, call your caregiver right away. You need to go to the hospital to get checked immediately. HOME CARE INSTRUCTIONS   Avoid all smoking, herbs, alcohol, and unprescribed drugs. These chemicals affect the formation and growth of the baby.  Follow your caregiver's instructions regarding medicine use. There are medicines that are either safe or unsafe to take during pregnancy.  Exercise only as directed by your caregiver. Experiencing uterine cramps is a good sign to stop exercising.  Continue to eat regular, healthy meals.  Wear a good support bra for breast tenderness.  Do not use hot tubs, steam rooms, or saunas.  Wear your seat belt at all times when driving.  Avoid raw  meat, uncooked cheese, cat litter boxes, and soil used by cats. These carry germs that can cause birth defects in the baby.  Take your prenatal vitamins.  Try taking a stool softener (if your caregiver approves) if you develop constipation. Eat more high-fiber foods, such as fresh vegetables or fruit and whole grains. Drink plenty of fluids to keep your urine clear or pale yellow.  Take warm sitz baths to soothe any pain or discomfort caused by hemorrhoids. Use hemorrhoid cream if your caregiver approves.  If you develop varicose veins, wear support hose. Elevate your feet for 15 minutes, 3-4 times a day. Limit salt in your diet.  Avoid heavy lifting, wear low   heal shoes, and practice good posture.  Rest a lot with your legs elevated if you have leg cramps or low back pain.  Visit your dentist if you have not gone during your pregnancy. Use a soft toothbrush to brush your teeth and be gentle when you floss.  A sexual relationship may be continued unless your caregiver directs you otherwise.  Do not travel far distances unless it is absolutely necessary and only with the approval of your caregiver.  Take prenatal classes to understand, practice, and ask questions about the labor and delivery.  Make a trial run to the hospital.  Pack your hospital bag.  Prepare the baby's nursery.  Continue to go to all your prenatal visits as directed by your caregiver. SEEK MEDICAL CARE IF:  You are unsure if you are in labor or if your water has broken.  You have dizziness.  You have mild pelvic cramps, pelvic pressure, or nagging pain in your abdominal area.  You have persistent nausea, vomiting, or diarrhea.  You have a bad smelling vaginal discharge.  You have pain with urination. SEEK IMMEDIATE MEDICAL CARE IF:   You have a fever.  You are leaking fluid from your vagina.  You have spotting or bleeding from your vagina.  You have severe abdominal cramping or pain.  You have  rapid weight loss or gain.  You have shortness of breath with chest pain.  You notice sudden or extreme swelling of your face, hands, ankles, feet, or legs.  You have not felt your baby move in over an hour.  You have severe headaches that do not go away with medicine.  You have vision changes. Document Released: 11/08/2001 Document Revised: 11/19/2013 Document Reviewed: 01/15/2013 [employer] Patient Information 2015 [employer]. This information is not intended to replace advice given to you by your health care provider. Make sure you discuss any questions you have with your health care provider.  Breastfeeding Deciding to breastfeed is one of the best choices you can make for you and your baby. A change in hormones during pregnancy causes your breast tissue to grow and increases the number and size of your milk ducts. These hormones also allow proteins, sugars, and fats from your blood supply to make breast milk in your milk-producing glands. Hormones prevent breast milk from being released before your baby is born as well as prompt milk flow after birth. Once breastfeeding has begun, thoughts of your baby, as well as his or her sucking or crying, can stimulate the release of milk from your milk-producing glands.  BENEFITS OF BREASTFEEDING For Your Baby  Your first milk (colostrum) helps your baby's digestive system function better.   There are antibodies in your milk that help your baby fight off infections.   Your baby has a lower incidence of asthma, allergies, and sudden infant death syndrome.   The nutrients in breast milk are better for your baby than infant formulas and are designed uniquely for your baby's needs.   Breast milk improves your baby's brain development.   Your baby is less likely to develop other conditions, such as childhood obesity, asthma, or type 2 diabetes mellitus.  For You   Breastfeeding helps to create a very special bond between you and your  baby.   Breastfeeding is convenient. Breast milk is always available at the correct temperature and costs nothing.   Breastfeeding helps to burn calories and helps you lose the weight gained during pregnancy.   Breastfeeding makes your uterus contract   to its prepregnancy size faster and slows bleeding (lochia) after you give birth.   Breastfeeding helps to lower your risk of developing type 2 diabetes mellitus, osteoporosis, and breast or ovarian cancer later in life. SIGNS THAT YOUR BABY IS HUNGRY Early Signs of Hunger  Increased alertness or activity.  Stretching.  Movement of the head from side to side.  Movement of the head and opening of the mouth when the corner of the mouth or cheek is stroked (rooting).  Increased sucking sounds, smacking lips, cooing, sighing, or squeaking.  Hand-to-mouth movements.  Increased sucking of fingers or hands. Late Signs of Hunger  Fussing.  Intermittent crying. Extreme Signs of Hunger Signs of extreme hunger will require calming and consoling before your baby will be able to breastfeed successfully. Do not wait for the following signs of extreme hunger to occur before you initiate breastfeeding:   Restlessness.  A loud, strong cry.   Screaming. BREASTFEEDING BASICS Breastfeeding Initiation  Find a comfortable place to sit or lie down, with your neck and back well supported.  Place a pillow or rolled up blanket under your baby to bring him or her to the level of your breast (if you are seated). Nursing pillows are specially designed to help support your arms and your baby while you breastfeed.  Make sure that your baby's abdomen is facing your abdomen.   Gently massage your breast. With your fingertips, massage from your chest wall toward your nipple in a circular motion. This encourages milk flow. You may need to continue this action during the feeding if your milk flows slowly.  Support your breast with 4 fingers  underneath and your thumb above your nipple. Make sure your fingers are well away from your nipple and your baby's mouth.   Stroke your baby's lips gently with your finger or nipple.   When your baby's mouth is open wide enough, quickly bring your baby to your breast, placing your entire nipple and as much of the colored area around your nipple (areola) as possible into your baby's mouth.   More areola should be visible above your baby's upper lip than below the lower lip.   Your baby's tongue should be between his or her lower gum and your breast.   Ensure that your baby's mouth is correctly positioned around your nipple (latched). Your baby's lips should create a seal on your breast and be turned out (everted).  It is common for your baby to suck about 2-3 minutes in order to start the flow of breast milk. Latching Teaching your baby how to latch on to your breast properly is very important. An improper latch can cause nipple pain and decreased milk supply for you and poor weight gain in your baby. Also, if your baby is not latched onto your nipple properly, he or she may swallow some air during feeding. This can make your baby fussy. Burping your baby when you switch breasts during the feeding can help to get rid of the air. However, teaching your baby to latch on properly is still the best way to prevent fussiness from swallowing air while breastfeeding. Signs that your baby has successfully latched on to your nipple:    Silent tugging or silent sucking, without causing you pain.   Swallowing heard between every 3-4 sucks.    Muscle movement above and in front of his or her ears while sucking.  Signs that your baby has not successfully latched on to nipple:   Sucking sounds   or smacking sounds from your baby while breastfeeding.  Nipple pain. If you think your baby has not latched on correctly, slip your finger into the corner of your baby's mouth to break the suction and place  it between your baby's gums. Attempt breastfeeding initiation again. Signs of Successful Breastfeeding Signs from your baby:   A gradual decrease in the number of sucks or complete cessation of sucking.   Falling asleep.   Relaxation of his or her body.   Retention of a small amount of milk in his or her mouth.   Letting go of your breast by himself or herself. Signs from you:  Breasts that have increased in firmness, weight, and size 1-3 hours after feeding.   Breasts that are softer immediately after breastfeeding.  Increased milk volume, as well as a change in milk consistency and color by the fifth day of breastfeeding.   Nipples that are not sore, cracked, or bleeding. Signs That Your Baby is Getting Enough Milk  Wetting at least 3 diapers in a 24-hour period. The urine should be clear and pale yellow by age 5 days.  At least 3 stools in a 24-hour period by age 5 days. The stool should be soft and yellow.  At least 3 stools in a 24-hour period by age 7 days. The stool should be seedy and yellow.  No loss of weight greater than 10% of birth weight during the first 3 days of age.  Average weight gain of 4-7 ounces (113-198 g) per week after age 4 days.  Consistent daily weight gain by age 5 days, without weight loss after the age of 2 weeks. After a feeding, your baby may spit up a small amount. This is common. BREASTFEEDING FREQUENCY AND DURATION Frequent feeding will help you make more milk and can prevent sore nipples and breast engorgement. Breastfeed when you feel the need to reduce the fullness of your breasts or when your baby shows signs of hunger. This is called "breastfeeding on demand." Avoid introducing a pacifier to your baby while you are working to establish breastfeeding (the first 4-6 weeks after your baby is born). After this time you may choose to use a pacifier. Research has shown that pacifier use during the first year of a baby's life decreases the  risk of sudden infant death syndrome (SIDS). Allow your baby to feed on each breast as long as he or she wants. Breastfeed until your baby is finished feeding. When your baby unlatches or falls asleep while feeding from the first breast, offer the second breast. Because newborns are often sleepy in the first few weeks of life, you may need to awaken your baby to get him or her to feed. Breastfeeding times will vary from baby to baby. However, the following rules can serve as a guide to help you ensure that your baby is properly fed:  Newborns (babies 4 weeks of age or younger) may breastfeed every 1-3 hours.  Newborns should not go longer than 3 hours during the day or 5 hours during the night without breastfeeding.  You should breastfeed your baby a minimum of 8 times in a 24-hour period until you begin to introduce solid foods to your baby at around 6 months of age. BREAST MILK PUMPING Pumping and storing breast milk allows you to ensure that your baby is exclusively fed your breast milk, even at times when you are unable to breastfeed. This is especially important if you are going back to work   while you are still breastfeeding or when you are not able to be present during feedings. Your lactation consultant can give you guidelines on how long it is safe to store breast milk.  A breast pump is a machine that allows you to pump milk from your breast into a sterile bottle. The pumped breast milk can then be stored in a refrigerator or freezer. Some breast pumps are operated by hand, while others use electricity. Ask your lactation consultant which type will work best for you. Breast pumps can be purchased, but some hospitals and breastfeeding support groups lease breast pumps on a monthly basis. A lactation consultant can teach you how to hand express breast milk, if you prefer not to use a pump.  CARING FOR YOUR BREASTS WHILE YOU BREASTFEED Nipples can become dry, cracked, and sore while breastfeeding.  The following recommendations can help keep your breasts moisturized and healthy:  Avoid using soap on your nipples.   Wear a supportive bra. Although not required, special nursing bras and tank tops are designed to allow access to your breasts for breastfeeding without taking off your entire bra or top. Avoid wearing underwire-style bras or extremely tight bras.  Air dry your nipples for 3-4minutes after each feeding.   Use only cotton bra pads to absorb leaked breast milk. Leaking of breast milk between feedings is normal.   Use lanolin on your nipples after breastfeeding. Lanolin helps to maintain your skin's normal moisture barrier. If you use pure lanolin, you do not need to wash it off before feeding your baby again. Pure lanolin is not toxic to your baby. You may also hand express a few drops of breast milk and gently massage that milk into your nipples and allow the milk to air dry. In the first few weeks after giving birth, some women experience extremely full breasts (engorgement). Engorgement can make your breasts feel heavy, warm, and tender to the touch. Engorgement peaks within 3-5 days after you give birth. The following recommendations can help ease engorgement:  Completely empty your breasts while breastfeeding or pumping. You may want to start by applying warm, moist heat (in the shower or with warm water-soaked hand towels) just before feeding or pumping. This increases circulation and helps the milk flow. If your baby does not completely empty your breasts while breastfeeding, pump any extra milk after he or she is finished.  Wear a snug bra (nursing or regular) or tank top for 1-2 days to signal your body to slightly decrease milk production.  Apply ice packs to your breasts, unless this is too uncomfortable for you.  Make sure that your baby is latched on and positioned properly while breastfeeding. If engorgement persists after 48 hours of following these  recommendations, contact your health care provider or a lactation consultant. OVERALL HEALTH CARE RECOMMENDATIONS WHILE BREASTFEEDING  Eat healthy foods. Alternate between meals and snacks, eating 3 of each per day. Because what you eat affects your breast milk, some of the foods may make your baby more irritable than usual. Avoid eating these foods if you are sure that they are negatively affecting your baby.  Drink milk, fruit juice, and water to satisfy your thirst (about 10 glasses a day).   Rest often, relax, and continue to take your prenatal vitamins to prevent fatigue, stress, and anemia.  Continue breast self-awareness checks.  Avoid chewing and smoking tobacco.  Avoid alcohol and drug use. Some medicines that may be harmful to your baby can pass through   breast milk. It is important to ask your health care provider before taking any medicine, including all over-the-counter and prescription medicine as well as vitamin and herbal supplements. It is possible to become pregnant while breastfeeding. If birth control is desired, ask your health care provider about options that will be safe for your baby. SEEK MEDICAL CARE IF:   You feel like you want to stop breastfeeding or have become frustrated with breastfeeding.  You have painful breasts or nipples.  Your nipples are cracked or bleeding.  Your breasts are red, tender, or warm.  You have a swollen area on either breast.  You have a fever or chills.  You have nausea or vomiting.  You have drainage other than breast milk from your nipples.  Your breasts do not become full before feedings by the fifth day after you give birth.  You feel sad and depressed.  Your baby is too sleepy to eat well.  Your baby is having trouble sleeping.   Your baby is wetting less than 3 diapers in a 24-hour period.  Your baby has less than 3 stools in a 24-hour period.  Your baby's skin or the white part of his or her eyes becomes  yellow.   Your baby is not gaining weight by 5 days of age. SEEK IMMEDIATE MEDICAL CARE IF:   Your baby is overly tired (lethargic) and does not want to wake up and feed.  Your baby develops an unexplained fever. Document Released: 11/14/2005 Document Revised: 11/19/2013 Document Reviewed: 05/08/2013 ExitCare Patient Information 2015 [employer]. This information is not intended to replace advice given to you by your health care provider. Make sure you discuss any questions you have with your health care provider.  

## 2014-11-24 NOTE — Progress Notes (Signed)
C/o pelvic pain  And pressure .

## 2014-11-28 NOTE — L&D Delivery Note (Signed)
Delivery Note At 3:50 PM a viable female was delivered via Vaginal, Spontaneous Delivery (Presentation: ;  ).  APGAR: 7, 8; weight 7 lb 5.8 oz (3340 g).   Placenta status: Intact, Spontaneous.  Cord: 3 vessels with the following complications: None.  Delivery had been partially unwitnessed. Patient had urge to push. Lower torso covered by sheet at the time. Upon sheet removal baby's head was delivered. Complete delivery occurred w/o any issues. No significant bleeding postpartum. No lacerations.   Anesthesia: None  Episiotomy: None Lacerations: None Suture Repair: n/a Est. Blood Loss (mL): 200  Mom to postpartum.  Baby to Couplet care / Skin to Skin.  Kathee DeltonMcKeag, Ian D 01/09/2015, 8:06 PM   I entered the room and uncovered the pt as she was bearing down involuntarily. I had only been out of the room for a couple of minutes from when pt was noted to be 5cm and membranes were ruptured for thin MSF before she began to feel like she had to push. When sheet was removed the head was noted to be out; shoulders assisted out and infant placed on pt's abd and dried. Cord clamped and cut by me. Infant's facial features extremely edematous, consistent w/ unusual exam during AROM suspicious for face presentation.  Dr Wende MottMcKeag in and completed 3rd stage/inspection.  Cam HaiSHAW, Rachael Ferrie CNM 10:32 PM 01/09/2015

## 2014-12-01 ENCOUNTER — Encounter: Payer: Self-pay | Admitting: *Deleted

## 2014-12-08 ENCOUNTER — Encounter: Payer: Self-pay | Admitting: Obstetrics and Gynecology

## 2014-12-08 ENCOUNTER — Ambulatory Visit (INDEPENDENT_AMBULATORY_CARE_PROVIDER_SITE_OTHER): Payer: Medicaid Other | Admitting: Obstetrics and Gynecology

## 2014-12-08 VITALS — BP 130/85 | HR 90 | Temp 97.5°F | Wt 188.7 lb

## 2014-12-08 DIAGNOSIS — O0993 Supervision of high risk pregnancy, unspecified, third trimester: Secondary | ICD-10-CM

## 2014-12-08 MED ORDER — ONDANSETRON HCL 4 MG PO TABS: 4.0000 mg | ORAL_TABLET | Freq: Three times a day (TID) | ORAL | Status: DC | PRN

## 2014-12-08 NOTE — Progress Notes (Signed)
Patient reports nausea and vomiting after eating/drinking for past few days

## 2014-12-08 NOTE — Progress Notes (Signed)
S: Pt states that she is doing OK. She has noticed some mild feet swelling and some nausea and has not been able to eat anything well. This has been going on for past 3 days. Happens when she's at work. Has thrown up about 2x. No RUQ pain. Denies HA, scotoma.   Has noticed some mild contractions, but often.  + Fm, no LOF, no VB.  + FHTS, s=d. Reassuring. 30 proteinuria --> no change in proteinuria from previous visit. Given increasing BP will f/up 1 wk.

## 2014-12-09 ENCOUNTER — Encounter: Payer: Medicaid Other | Admitting: Advanced Practice Midwife

## 2014-12-10 LAB — POCT URINALYSIS DIP (DEVICE)
Bilirubin Urine: NEGATIVE
Glucose, UA: NEGATIVE mg/dL
Hgb urine dipstick: NEGATIVE
Ketones, ur: NEGATIVE mg/dL
NITRITE: NEGATIVE
PH: 6.5 (ref 5.0–8.0)
PROTEIN: 30 mg/dL — AB
Specific Gravity, Urine: 1.025 (ref 1.005–1.030)
Urobilinogen, UA: 2 mg/dL — ABNORMAL HIGH (ref 0.0–1.0)

## 2014-12-15 ENCOUNTER — Encounter: Payer: Medicaid Other | Admitting: Obstetrics & Gynecology

## 2014-12-16 ENCOUNTER — Ambulatory Visit (INDEPENDENT_AMBULATORY_CARE_PROVIDER_SITE_OTHER): Payer: Medicaid Other | Admitting: Obstetrics & Gynecology

## 2014-12-16 ENCOUNTER — Encounter: Payer: Self-pay | Admitting: Obstetrics & Gynecology

## 2014-12-16 VITALS — BP 106/77 | HR 114 | Temp 97.8°F | Wt 190.1 lb

## 2014-12-16 DIAGNOSIS — Z3493 Encounter for supervision of normal pregnancy, unspecified, third trimester: Secondary | ICD-10-CM

## 2014-12-16 LAB — POCT URINALYSIS DIP (DEVICE)
GLUCOSE, UA: NEGATIVE mg/dL
Hgb urine dipstick: NEGATIVE
Ketones, ur: 15 mg/dL — AB
Leukocytes, UA: NEGATIVE
Nitrite: NEGATIVE
PH: 7 (ref 5.0–8.0)
Protein, ur: 100 mg/dL — AB
SPECIFIC GRAVITY, URINE: 1.02 (ref 1.005–1.030)
Urobilinogen, UA: >=8 mg/dL (ref 0.0–1.0)

## 2014-12-16 LAB — OB RESULTS CONSOLE GBS: STREP GROUP B AG: NEGATIVE

## 2014-12-16 NOTE — Progress Notes (Signed)
Pt with no complaints. +FM, No ctx, No LOF. 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

## 2014-12-16 NOTE — Patient Instructions (Signed)
Etonogestrel implant What is this medicine? ETONOGESTREL (et oh noe JES trel) is a contraceptive (birth control) device. It is used to prevent pregnancy. It can be used for up to 3 years. This medicine may be used for other purposes; ask your health care provider or pharmacist if you have questions. COMMON BRAND NAME(S): Implanon, Nexplanon What should I tell my health care provider before I take this medicine? They need to know if you have any of these conditions: -abnormal vaginal bleeding -blood vessel disease or blood clots -cancer of the breast, cervix, or liver -depression -diabetes -gallbladder disease -headaches -heart disease or recent heart attack -high blood pressure -high cholesterol -kidney disease -liver disease -renal disease -seizures -tobacco smoker -an unusual or allergic reaction to etonogestrel, other hormones, anesthetics or antiseptics, medicines, foods, dyes, or preservatives -pregnant or trying to get pregnant -breast-feeding How should I use this medicine? This device is inserted just under the skin on the inner side of your upper arm by a health care professional. Talk to your pediatrician regarding the use of this medicine in children. Special care may be needed. Overdosage: If you think you've taken too much of this medicine contact a poison control center or emergency room at once. Overdosage: If you think you have taken too much of this medicine contact a poison control center or emergency room at once. NOTE: This medicine is only for you. Do not share this medicine with others. What if I miss a dose? This does not apply. What may interact with this medicine? Do not take this medicine with any of the following medications: -amprenavir -bosentan -fosamprenavir This medicine may also interact with the following medications: -barbiturate medicines for inducing sleep or treating seizures -certain medicines for fungal infections like ketoconazole and  itraconazole -griseofulvin -medicines to treat seizures like carbamazepine, felbamate, oxcarbazepine, phenytoin, topiramate -modafinil -phenylbutazone -rifampin -some medicines to treat HIV infection like atazanavir, indinavir, lopinavir, nelfinavir, tipranavir, ritonavir -St. John's wort This list may not describe all possible interactions. Give your health care provider a list of all the medicines, herbs, non-prescription drugs, or dietary supplements you use. Also tell them if you smoke, drink alcohol, or use illegal drugs. Some items may interact with your medicine. What should I watch for while using this medicine? This product does not protect you against HIV infection (AIDS) or other sexually transmitted diseases. You should be able to feel the implant by pressing your fingertips over the skin where it was inserted. Tell your doctor if you cannot feel the implant. What side effects may I notice from receiving this medicine? Side effects that you should report to your doctor or health care professional as soon as possible: -allergic reactions like skin rash, itching or hives, swelling of the face, lips, or tongue -breast lumps -changes in vision -confusion, trouble speaking or understanding -dark urine -depressed mood -general ill feeling or flu-like symptoms -light-colored stools -loss of appetite, nausea -right upper belly pain -severe headaches -severe pain, swelling, or tenderness in the abdomen -shortness of breath, chest pain, swelling in a leg -signs of pregnancy -sudden numbness or weakness of the face, arm or leg -trouble walking, dizziness, loss of balance or coordination -unusual vaginal bleeding, discharge -unusually weak or tired -yellowing of the eyes or skin Side effects that usually do not require medical attention (Report these to your doctor or health care professional if they continue or are bothersome.): -acne -breast pain -changes in  weight -cough -fever or chills -headache -irregular menstrual bleeding -itching, burning, and   vaginal discharge -pain or difficulty passing urine -sore throat This list may not describe all possible side effects. Call your doctor for medical advice about side effects. You may report side effects to FDA at 1-800-FDA-1088. Where should I keep my medicine? This drug is given in a hospital or clinic and will not be stored at home. NOTE: This sheet is a summary. It may not cover all possible information. If you have questions about this medicine, talk to your doctor, pharmacist, or health care provider.  2015, Elsevier/Gold Standard. (2012-05-21 15:37:45) Levonorgestrel intrauterine device (IUD) What is this medicine? LEVONORGESTREL IUD (LEE voe nor jes trel) is a contraceptive (birth control) device. The device is placed inside the uterus by a healthcare professional. It is used to prevent pregnancy and can also be used to treat heavy bleeding that occurs during your period. Depending on the device, it can be used for 3 to 5 years. This medicine may be used for other purposes; ask your health care provider or pharmacist if you have questions. COMMON BRAND NAME(S): LILETTA, Mirena, Skyla What should I tell my health care provider before I take this medicine? They need to know if you have any of these conditions: -abnormal Pap smear -cancer of the breast, uterus, or cervix -diabetes -endometritis -genital or pelvic infection now or in the past -have more than one sexual partner or your partner has more than one partner -heart disease -history of an ectopic or tubal pregnancy -immune system problems -IUD in place -liver disease or tumor -problems with blood clots or take blood-thinners -use intravenous drugs -uterus of unusual shape -vaginal bleeding that has not been explained -an unusual or allergic reaction to levonorgestrel, other hormones, silicone, or polyethylene, medicines,  foods, dyes, or preservatives -pregnant or trying to get pregnant -breast-feeding How should I use this medicine? This device is placed inside the uterus by a health care professional. Talk to your pediatrician regarding the use of this medicine in children. Special care may be needed. Overdosage: If you think you have taken too much of this medicine contact a poison control center or emergency room at once. NOTE: This medicine is only for you. Do not share this medicine with others. What if I miss a dose? This does not apply. What may interact with this medicine? Do not take this medicine with any of the following medications: -amprenavir -bosentan -fosamprenavir This medicine may also interact with the following medications: -aprepitant -barbiturate medicines for inducing sleep or treating seizures -bexarotene -griseofulvin -medicines to treat seizures like carbamazepine, ethotoin, felbamate, oxcarbazepine, phenytoin, topiramate -modafinil -pioglitazone -rifabutin -rifampin -rifapentine -some medicines to treat HIV infection like atazanavir, indinavir, lopinavir, nelfinavir, tipranavir, ritonavir -St. John's wort -warfarin This list may not describe all possible interactions. Give your health care provider a list of all the medicines, herbs, non-prescription drugs, or dietary supplements you use. Also tell them if you smoke, drink alcohol, or use illegal drugs. Some items may interact with your medicine. What should I watch for while using this medicine? Visit your doctor or health care professional for regular check ups. See your doctor if you or your partner has sexual contact with others, becomes HIV positive, or gets a sexual transmitted disease. This product does not protect you against HIV infection (AIDS) or other sexually transmitted diseases. You can check the placement of the IUD yourself by reaching up to the top of your vagina with clean fingers to feel the threads. Do  not pull on the threads. It is a good   habit to check placement after each menstrual period. Call your doctor right away if you feel more of the IUD than just the threads or if you cannot feel the threads at all. The IUD may come out by itself. You may become pregnant if the device comes out. If you notice that the IUD has come out use a backup birth control method like condoms and call your health care provider. Using tampons will not change the position of the IUD and are okay to use during your period. What side effects may I notice from receiving this medicine? Side effects that you should report to your doctor or health care professional as soon as possible: -allergic reactions like skin rash, itching or hives, swelling of the face, lips, or tongue -fever, flu-like symptoms -genital sores -high blood pressure -no menstrual period for 6 weeks during use -pain, swelling, warmth in the leg -pelvic pain or tenderness -severe or sudden headache -signs of pregnancy -stomach cramping -sudden shortness of breath -trouble with balance, talking, or walking -unusual vaginal bleeding, discharge -yellowing of the eyes or skin Side effects that usually do not require medical attention (report to your doctor or health care professional if they continue or are bothersome): -acne -breast pain -change in sex drive or performance -changes in weight -cramping, dizziness, or faintness while the device is being inserted -headache -irregular menstrual bleeding within first 3 to 6 months of use -nausea This list may not describe all possible side effects. Call your doctor for medical advice about side effects. You may report side effects to FDA at 1-800-FDA-1088. Where should I keep my medicine? This does not apply. NOTE: This sheet is a summary. It may not cover all possible information. If you have questions about this medicine, talk to your doctor, pharmacist, or health care provider.  2015,  Elsevier/Gold Standard. (2011-12-15 13:54:04)  

## 2014-12-16 NOTE — Progress Notes (Signed)
C/o of pelvic pressure especially with walking.  Occasional edema in feet.

## 2014-12-17 LAB — CULTURE, BETA STREP (GROUP B ONLY)

## 2014-12-17 LAB — GC/CHLAMYDIA PROBE AMP
CT Probe RNA: NEGATIVE
GC Probe RNA: NEGATIVE

## 2014-12-22 ENCOUNTER — Encounter: Payer: Self-pay | Admitting: *Deleted

## 2014-12-22 ENCOUNTER — Encounter: Payer: Self-pay | Admitting: Obstetrics & Gynecology

## 2014-12-30 ENCOUNTER — Telehealth: Payer: Self-pay

## 2014-12-30 ENCOUNTER — Inpatient Hospital Stay (HOSPITAL_COMMUNITY)
Admission: AD | Admit: 2014-12-30 | Discharge: 2014-12-30 | Disposition: A | Discharge status: Home / Self Care | Payer: Medicaid Other | Source: Ambulatory Visit | Attending: Family Medicine | Admitting: Family Medicine

## 2014-12-30 ENCOUNTER — Encounter (HOSPITAL_COMMUNITY): Payer: Self-pay | Admitting: *Deleted

## 2014-12-30 DIAGNOSIS — Z3A38 38 weeks gestation of pregnancy: Secondary | ICD-10-CM

## 2014-12-30 DIAGNOSIS — O10013 Pre-existing essential hypertension complicating pregnancy, third trimester: Secondary | ICD-10-CM | POA: Diagnosis present

## 2014-12-30 DIAGNOSIS — Z3A37 37 weeks gestation of pregnancy: Secondary | ICD-10-CM | POA: Insufficient documentation

## 2014-12-30 DIAGNOSIS — O133 Gestational [pregnancy-induced] hypertension without significant proteinuria, third trimester: Secondary | ICD-10-CM

## 2014-12-30 LAB — CBC
HEMATOCRIT: 31.1 % — AB (ref 36.0–46.0)
HEMOGLOBIN: 9.6 g/dL — AB (ref 12.0–15.0)
MCH: 21.6 pg — ABNORMAL LOW (ref 26.0–34.0)
MCHC: 30.9 g/dL (ref 30.0–36.0)
MCV: 70 fL — AB (ref 78.0–100.0)
Platelets: 247 10*3/uL (ref 150–400)
RBC: 4.44 MIL/uL (ref 3.87–5.11)
RDW: 17.4 % — ABNORMAL HIGH (ref 11.5–15.5)
WBC: 8.2 10*3/uL (ref 4.0–10.5)

## 2014-12-30 LAB — COMPREHENSIVE METABOLIC PANEL
ALK PHOS: 203 U/L — AB (ref 39–117)
ALT: 8 U/L (ref 0–35)
AST: 16 U/L (ref 0–37)
Albumin: 3 g/dL — ABNORMAL LOW (ref 3.5–5.2)
Anion gap: 7 (ref 5–15)
BUN: 7 mg/dL (ref 6–23)
CHLORIDE: 105 mmol/L (ref 96–112)
CO2: 20 mmol/L (ref 19–32)
CREATININE: 0.61 mg/dL (ref 0.50–1.10)
Calcium: 9 mg/dL (ref 8.4–10.5)
GFR calc Af Amer: >90 mL/min (ref >90)
GFR calc non Af Amer: >90 mL/min (ref >90)
Glucose, Bld: 73 mg/dL (ref 70–99)
Potassium: 3.7 mmol/L (ref 3.5–5.1)
SODIUM: 132 mmol/L — AB (ref 135–145)
TOTAL PROTEIN: 6.9 g/dL (ref 6.0–8.3)
Total Bilirubin: 0.4 mg/dL (ref 0.3–1.2)

## 2014-12-30 LAB — PROTEIN / CREATININE RATIO, URINE
Creatinine, Urine: 193 mg/dL
Protein Creatinine Ratio: 0.22 — ABNORMAL HIGH (ref 0.00–0.15)
TOTAL PROTEIN, URINE: 42 mg/dL

## 2014-12-30 MED ORDER — BUTALBITAL-APAP-CAFFEINE 50-325-40 MG PO TABS
2.0000 | ORAL_TABLET | Freq: Once | ORAL | Status: AC
  Administered 2014-12-30: 2 via ORAL
  Filled 2014-12-30: qty 2

## 2014-12-30 NOTE — Discharge Instructions (Signed)

## 2014-12-30 NOTE — MAU Provider Note (Signed)
History     CSN: [id number]  Arrival date and time: 12/30/14 1925   None     Chief Complaint  Patient presents with  . Hypertension   HPI This is a 29 y.o. female at [redacted]w[redacted]d who presents for evaluation of hypertension in pregnancy. Has had some hypertension in all of her pregnancies and intermittently through this one. Developed a headache today. No visual changes or abdominal pain. No edema.   RN Note:  Expand All Collapse All   Pt states she has been having headaches, because of her history of elevated bp pt states she called the nurse line and was directed to come in.pt states [facility] and bp was elevated 149/110          OB History    Gravida Para Term Preterm AB TAB SAB Ectopic Multiple Living   0 0 0 5      Past Medical History  Diagnosis Date  . Pregnancy induced hypertension     Past Surgical History  Procedure Laterality Date  . No past surgeries      Family History  Problem Relation Age of Onset  . Cancer Maternal Grandmother   . Hypertension Paternal Grandmother     History  Substance Use Topics  . Smoking status: Never Smoker   . Smokeless tobacco: Never Used  . Alcohol Use: No    Allergies: No Known Allergies  Prescriptions prior to admission  Medication Sig Dispense Refill Last Dose  . aspirin EC 81 MG tablet Take 1 tablet (81 mg total) by mouth daily. 30 tablet 8 Past Week at Unknown time  . ondansetron (ZOFRAN) 4 MG tablet Take 1 tablet (4 mg total) by mouth every 8 (eight) hours as needed for nausea or vomiting. 20 tablet 0 Past Month at Unknown time  . Prenatal Vit-Fe Fumarate-FA (PRENATAL COMPLETE) 14-0.4 MG TABS Take 1 tablet by mouth daily. 60 each 1 12/29/2014 at Unknown time    Review of Systems  Constitutional: Negative for fever, chills and malaise/fatigue.  Eyes: Negative for blurred vision and double vision.  Gastrointestinal: Negative for nausea, vomiting and abdominal pain.  Neurological:  Positive for headaches. Negative for dizziness, sensory change, speech change and focal weakness.   Physical Exam   Blood pressure 120/69, pulse 84, temperature 98.4 F (36.9 C), temperature source Oral, resp. rate 18, height  (1.626 m), weight 187 lb (84.823 kg), last menstrual period 04/09/2014, SpO2 100 %, unknown if currently breastfeeding.  Filed Vitals:   12/30/14 2120 12/30/14 2135 12/30/14 2150 12/30/14 2202  BP: 122/91 120/69 125/92 139/88  Pulse: 96 84 93 83  Temp:      TempSrc:      Resp:      Height:      Weight:      SpO2:        Physical Exam  Constitutional: She is oriented to person, place, and time. She appears well-developed and well-nourished. No distress.  HENT:  Head: Normocephalic.  Cardiovascular: Normal rate.   Respiratory: Effort normal.  GI: Soft. She exhibits no distension. There is no tenderness. There is no rebound and no guarding.  Musculoskeletal: Normal range of motion. She exhibits no edema.  Neurological: She is alert and oriented to person, place, and time. She has normal reflexes. She displays normal reflexes. She exhibits normal muscle tone.  Skin: Skin is warm and dry.  Psychiatric: She has a normal mood and  affect.    MAU Course  Procedures  MDM Results for orders placed or performed during the hospital encounter of 12/30/14 (from the past 24 hour(s))  Protein / creatinine ratio, urine     Status: Abnormal   Collection Time: 12/30/14  7:36 PM  Result Value Ref Range   Creatinine, Urine 193.00 mg/dL   Total Protein, Urine 42 mg/dL   Protein Creatinine Ratio 0.22 (H) 0.00 - 0.15  CBC     Status: Abnormal   Collection Time: 12/30/14  8:20 PM  Result Value Ref Range   WBC 8.2 4.0 - 10.5 K/uL   RBC 4.44 3.87 - 5.11 MIL/uL   Hemoglobin 9.6 (L) 12.0 - 15.0 g/dL   HCT 78.231.1 (L) 95.636.0 - 21.346.0 %   MCV 70.0 (L) 78.0 - 100.0 fL   MCH 21.6 (L) 26.0 - 34.0 pg   MCHC 30.9 30.0 - 36.0 g/dL   RDW 08.617.4 (H) 57.811.5 - 46.915.5 %   Platelets 247 150  - 400 K/uL  Comprehensive metabolic panel     Status: Abnormal   Collection Time: 12/30/14  8:20 PM  Result Value Ref Range   Sodium 132 (L) 135 - 145 mmol/L   Potassium 3.7 3.5 - 5.1 mmol/L   Chloride 105 96 - 112 mmol/L   CO2 20 19 - 32 mmol/L   Glucose, Bld 73 70 - 99 mg/dL   BUN 7 6 - 23 mg/dL   Creatinine, Ser 6.290.61 0.50 - 1.10 mg/dL   Calcium 9.0 8.4 - 52.810.5 mg/dL   Total Protein 6.9 6.0 - 8.3 g/dL   Albumin 3.0 (L) 3.5 - 5.2 g/dL   AST 16 0 - 37 U/L   ALT 8 0 - 35 U/L   Alkaline Phosphatase 203 (H) 39 - 117 U/L   Total Bilirubin 0.4 0.3 - 1.2 mg/dL   GFR calc non Af Amer >90 >90 mL/min   GFR calc Af Amer >90 >90 mL/min   Anion gap 7 5 - 15     Assessment and Plan  A:  SIUP at 2939w6d        Hypertension, no evidence of severe features  P;  Discussed with Dr Shawnie PonsPratt       Discharge home       Preeclampsia precautions       Has appt Thurs in clinic  [facility]Reeya Bound 12/30/2014, 9:41 PM

## 2014-12-30 NOTE — MAU Note (Signed)
Urine in lab 

## 2014-12-30 NOTE — MAU Note (Signed)
Pt states she has been having headaches, because of her history of elevated bp pt states she called the nurse line and was directed to come in.pt states she [facility] and bp was elevated 149/110

## 2014-12-30 NOTE — Telephone Encounter (Signed)
Pt called the front desk and stated that she was told that when she has a headache to call the office because she has high blood pressure.  I asked pt if this was the only headache and when did her headache start.  Pt stated that she has only had to one headache and it started about 30 to 40 minutes ago.  I asked pt if she attempted to take something for the headache she stated no.  I asked pt if she could get here before we closed today for BP check.  She stated "no, because I am all the way across town."  I advised pt to go to a pharmacy like [facility] to be able to see if she can check her BP, to please take tylenol to see if that helps relieve the headache pain, and if your BP is greater than 140/90 then to please go to MAU.  Pt stated understanding and that she would prefer to that since she is across town.  I agreed and let pt know that if she has any questions to please call the Clinics.  Pt agreed.

## 2014-12-31 ENCOUNTER — Encounter: Payer: Self-pay | Admitting: *Deleted

## 2014-12-31 NOTE — Progress Notes (Signed)
FMLA completed, will give to patient on 2/4 @ appointment.

## 2015-01-01 ENCOUNTER — Ambulatory Visit (INDEPENDENT_AMBULATORY_CARE_PROVIDER_SITE_OTHER): Payer: Medicaid Other | Admitting: Obstetrics & Gynecology

## 2015-01-01 ENCOUNTER — Encounter: Payer: Self-pay | Admitting: Obstetrics & Gynecology

## 2015-01-01 VITALS — BP 136/99 | HR 84 | Temp 97.3°F | Wt 188.8 lb

## 2015-01-01 DIAGNOSIS — Z8759 Personal history of other complications of pregnancy, childbirth and the puerperium: Secondary | ICD-10-CM

## 2015-01-01 LAB — POCT URINALYSIS DIP (DEVICE)
BILIRUBIN URINE: NEGATIVE
Glucose, UA: NEGATIVE mg/dL
HGB URINE DIPSTICK: NEGATIVE
KETONES UR: NEGATIVE mg/dL
NITRITE: NEGATIVE
Protein, ur: 30 mg/dL — AB
SPECIFIC GRAVITY, URINE: 1.015 (ref 1.005–1.030)
UROBILINOGEN UA: 0.2 mg/dL (ref 0.0–1.0)
pH: 7 (ref 5.0–8.0)

## 2015-01-01 NOTE — Patient Instructions (Signed)

## 2015-01-01 NOTE — Progress Notes (Signed)
Following for GHTN, had MAU visit 2/2 and bp was 130s/80-90s. PIH precautions given

## 2015-01-02 ENCOUNTER — Telehealth: Payer: Self-pay | Admitting: General Practice

## 2015-01-02 ENCOUNTER — Encounter: Payer: Self-pay | Admitting: General Practice

## 2015-01-02 NOTE — Telephone Encounter (Signed)
Patient called in to front office stating she received a work excuse yesterday but provided the wrong dates to the doctor and needs a letter for the 15th, 16th, and 17th. Called Dr Debroah LoopArnold who was agreeable to date change. Told patient we can provide her a new letter but she will need to come by the office to get it. Patient verbalized understanding and had no other questions

## 2015-01-05 ENCOUNTER — Encounter: Payer: Self-pay | Admitting: Obstetrics and Gynecology

## 2015-01-05 ENCOUNTER — Ambulatory Visit (INDEPENDENT_AMBULATORY_CARE_PROVIDER_SITE_OTHER): Payer: Medicaid Other | Admitting: Obstetrics and Gynecology

## 2015-01-05 VITALS — BP 131/81 | HR 91 | Temp 98.1°F | Wt 190.1 lb

## 2015-01-05 DIAGNOSIS — O0933 Supervision of pregnancy with insufficient antenatal care, third trimester: Secondary | ICD-10-CM

## 2015-01-05 DIAGNOSIS — O0993 Supervision of high risk pregnancy, unspecified, third trimester: Secondary | ICD-10-CM

## 2015-01-05 DIAGNOSIS — Z8759 Personal history of other complications of pregnancy, childbirth and the puerperium: Secondary | ICD-10-CM

## 2015-01-05 DIAGNOSIS — O09213 Supervision of pregnancy with history of pre-term labor, third trimester: Secondary | ICD-10-CM

## 2015-01-05 LAB — POCT URINALYSIS DIP (DEVICE)
BILIRUBIN URINE: NEGATIVE
Glucose, UA: NEGATIVE mg/dL
Hgb urine dipstick: NEGATIVE
Ketones, ur: NEGATIVE mg/dL
NITRITE: NEGATIVE
PH: 7.5 (ref 5.0–8.0)
PROTEIN: 30 mg/dL — AB
Specific Gravity, Urine: 1.02 (ref 1.005–1.030)
Urobilinogen, UA: 0.2 mg/dL (ref 0.0–1.0)

## 2015-01-05 NOTE — Progress Notes (Signed)
Patient is doing well without complaints. FM/labor precautions reviewed 

## 2015-01-06 ENCOUNTER — Telehealth: Payer: Self-pay | Admitting: *Deleted

## 2015-01-06 NOTE — Telephone Encounter (Signed)
Pt came to clinic and requested to speak to a nurse regarding her work restriction and disability papers which had been previously completed. She requested revisions based on what her employer told her was necessary as papers were not completed correctly. After review and discussion, I made corrections as requested.  Pt requested the ADA forms to be faxed to her employer and she will submit the disability forms to employer personally. Release of information form signed. Pt was given original copies of all papers and ADA forms were faxed as requested. .Marland Kitchen

## 2015-01-09 ENCOUNTER — Encounter (HOSPITAL_COMMUNITY): Payer: Self-pay | Admitting: *Deleted

## 2015-01-09 ENCOUNTER — Inpatient Hospital Stay (HOSPITAL_COMMUNITY)
Admission: AD | Admit: 2015-01-09 | Discharge: 2015-01-11 | DRG: 775 | Disposition: A | Discharge status: Home / Self Care | Payer: Medicaid Other | Source: Ambulatory Visit | Attending: Family Medicine | Admitting: Family Medicine

## 2015-01-09 DIAGNOSIS — O99824 Streptococcus B carrier state complicating childbirth: Secondary | ICD-10-CM | POA: Diagnosis present

## 2015-01-09 DIAGNOSIS — O133 Gestational [pregnancy-induced] hypertension without significant proteinuria, third trimester: Secondary | ICD-10-CM | POA: Diagnosis present

## 2015-01-09 DIAGNOSIS — Z8759 Personal history of other complications of pregnancy, childbirth and the puerperium: Secondary | ICD-10-CM

## 2015-01-09 DIAGNOSIS — Z3483 Encounter for supervision of other normal pregnancy, third trimester: Secondary | ICD-10-CM | POA: Diagnosis present

## 2015-01-09 DIAGNOSIS — Z3A39 39 weeks gestation of pregnancy: Secondary | ICD-10-CM | POA: Diagnosis present

## 2015-01-09 DIAGNOSIS — O0993 Supervision of high risk pregnancy, unspecified, third trimester: Secondary | ICD-10-CM

## 2015-01-09 DIAGNOSIS — O0933 Supervision of pregnancy with insufficient antenatal care, third trimester: Secondary | ICD-10-CM

## 2015-01-09 DIAGNOSIS — O139 Gestational [pregnancy-induced] hypertension without significant proteinuria, unspecified trimester: Secondary | ICD-10-CM | POA: Diagnosis present

## 2015-01-09 DIAGNOSIS — O09213 Supervision of pregnancy with history of pre-term labor, third trimester: Secondary | ICD-10-CM

## 2015-01-09 LAB — COMPREHENSIVE METABOLIC PANEL
ALT: 10 U/L (ref 0–35)
AST: 15 U/L (ref 0–37)
Albumin: 2.6 g/dL — ABNORMAL LOW (ref 3.5–5.2)
Alkaline Phosphatase: 193 U/L — ABNORMAL HIGH (ref 39–117)
Anion gap: 3 — ABNORMAL LOW (ref 5–15)
BUN: 5 mg/dL — ABNORMAL LOW (ref 6–23)
CALCIUM: 8.4 mg/dL (ref 8.4–10.5)
CHLORIDE: 109 mmol/L (ref 96–112)
CO2: 22 mmol/L (ref 19–32)
Creatinine, Ser: 0.53 mg/dL (ref 0.50–1.10)
GFR calc Af Amer: >90 mL/min (ref >90)
GFR calc non Af Amer: >90 mL/min (ref >90)
Glucose, Bld: 92 mg/dL (ref 70–99)
POTASSIUM: 3.9 mmol/L (ref 3.5–5.1)
Sodium: 134 mmol/L — ABNORMAL LOW (ref 135–145)
Total Bilirubin: 0.4 mg/dL (ref 0.3–1.2)
Total Protein: 6.6 g/dL (ref 6.0–8.3)

## 2015-01-09 LAB — URINALYSIS, ROUTINE W REFLEX MICROSCOPIC
Bilirubin Urine: NEGATIVE
Glucose, UA: NEGATIVE mg/dL
Ketones, ur: NEGATIVE mg/dL
Nitrite: NEGATIVE
Protein, ur: NEGATIVE mg/dL
SPECIFIC GRAVITY, URINE: 1.01 (ref 1.005–1.030)
UROBILINOGEN UA: 0.2 mg/dL (ref 0.0–1.0)
pH: 7 (ref 5.0–8.0)

## 2015-01-09 LAB — URINE MICROSCOPIC-ADD ON

## 2015-01-09 LAB — CBC
HCT: 28.8 % — ABNORMAL LOW (ref 36.0–46.0)
Hemoglobin: 8.9 g/dL — ABNORMAL LOW (ref 12.0–15.0)
MCH: 21.6 pg — AB (ref 26.0–34.0)
MCHC: 30.9 g/dL (ref 30.0–36.0)
MCV: 69.9 fL — ABNORMAL LOW (ref 78.0–100.0)
PLATELETS: 196 10*3/uL (ref 150–400)
RBC: 4.12 MIL/uL (ref 3.87–5.11)
RDW: 18.1 % — ABNORMAL HIGH (ref 11.5–15.5)
WBC: 12.6 10*3/uL — ABNORMAL HIGH (ref 4.0–10.5)

## 2015-01-09 LAB — PROTEIN / CREATININE RATIO, URINE
CREATININE, URINE: 50 mg/dL
Protein Creatinine Ratio: 0.34 — ABNORMAL HIGH (ref 0.00–0.15)
Total Protein, Urine: 17 mg/dL

## 2015-01-09 MED ORDER — PENICILLIN G POTASSIUM 5000000 UNITS IJ SOLR
5.0000 10*6.[IU] | Freq: Once | INTRAVENOUS | Status: AC
  Administered 2015-01-09: 5 10*6.[IU] via INTRAVENOUS
  Filled 2015-01-09: qty 5

## 2015-01-09 MED ORDER — OXYTOCIN 40 UNITS IN LACTATED RINGERS INFUSION - SIMPLE MED
62.5000 mL/h | INTRAVENOUS | Status: DC
  Administered 2015-01-09: 62.5 mL/h via INTRAVENOUS
  Filled 2015-01-09: qty 1000

## 2015-01-09 MED ORDER — ONDANSETRON HCL 4 MG PO TABS: 4.0000 mg | ORAL_TABLET | ORAL | Status: DC | PRN

## 2015-01-09 MED ORDER — OXYCODONE-ACETAMINOPHEN 5-325 MG PO TABS: 1.0000 | ORAL_TABLET | ORAL | Status: DC | PRN

## 2015-01-09 MED ORDER — LACTATED RINGERS IV SOLN: 500.0000 mL | INTRAVENOUS | Status: DC | PRN

## 2015-01-09 MED ORDER — DIPHENHYDRAMINE HCL 25 MG PO CAPS: 25.0000 mg | ORAL_CAPSULE | Freq: Four times a day (QID) | ORAL | Status: DC | PRN

## 2015-01-09 MED ORDER — ONDANSETRON HCL 4 MG/2ML IJ SOLN: 4.0000 mg | INTRAMUSCULAR | Status: DC | PRN

## 2015-01-09 MED ORDER — LACTATED RINGERS IV SOLN: INTRAVENOUS | Status: DC

## 2015-01-09 MED ORDER — PENICILLIN G POTASSIUM 5000000 UNITS IJ SOLR
2.5000 10*6.[IU] | INTRAMUSCULAR | Status: DC
  Filled 2015-01-09 (×4): qty 2.5

## 2015-01-09 MED ORDER — OXYTOCIN BOLUS FROM INFUSION
500.0000 mL | INTRAVENOUS | Status: DC
  Administered 2015-01-09: 500 mL via INTRAVENOUS

## 2015-01-09 MED ORDER — ZOLPIDEM TARTRATE 5 MG PO TABS: 5.0000 mg | ORAL_TABLET | Freq: Every evening | ORAL | Status: DC | PRN

## 2015-01-09 MED ORDER — DIBUCAINE 1 % RE OINT: 1.0000 "application " | TOPICAL_OINTMENT | RECTAL | Status: DC | PRN

## 2015-01-09 MED ORDER — SIMETHICONE 80 MG PO CHEW
80.0000 mg | CHEWABLE_TABLET | ORAL | Status: DC | PRN
Start: 2015-01-09 — End: 2015-01-11

## 2015-01-09 MED ORDER — MISOPROSTOL 25 MCG QUARTER TABLET
25.0000 ug | ORAL_TABLET | ORAL | Status: DC
  Administered 2015-01-09: 25 ug via VAGINAL
  Filled 2015-01-09: qty 1
  Filled 2015-01-09: qty 0.25
  Filled 2015-01-09: qty 1
  Filled 2015-01-09: qty 0.25
  Filled 2015-01-09 (×5): qty 1

## 2015-01-09 MED ORDER — OXYCODONE-ACETAMINOPHEN 5-325 MG PO TABS
2.0000 | ORAL_TABLET | ORAL | Status: DC | PRN
  Administered 2015-01-09: 2 via ORAL
  Filled 2015-01-09: qty 2

## 2015-01-09 MED ORDER — CITRIC ACID-SODIUM CITRATE 334-500 MG/5ML PO SOLN: 30.0000 mL | ORAL | Status: DC | PRN

## 2015-01-09 MED ORDER — PRENATAL MULTIVITAMIN CH
1.0000 | ORAL_TABLET | Freq: Every day | ORAL | Status: DC
  Administered 2015-01-10: 1 via ORAL
  Filled 2015-01-09: qty 1

## 2015-01-09 MED ORDER — TETANUS-DIPHTH-ACELL PERTUSSIS 5-2.5-18.5 LF-MCG/0.5 IM SUSP: 0.5000 mL | Freq: Once | INTRAMUSCULAR | Status: DC

## 2015-01-09 MED ORDER — LIDOCAINE HCL (PF) 1 % IJ SOLN
30.0000 mL | INTRAMUSCULAR | Status: DC | PRN
  Filled 2015-01-09: qty 30

## 2015-01-09 MED ORDER — FENTANYL CITRATE 0.05 MG/ML IJ SOLN
100.0000 ug | INTRAMUSCULAR | Status: DC | PRN
  Administered 2015-01-09 (×2): 100 ug via INTRAVENOUS
  Filled 2015-01-09 (×2): qty 2

## 2015-01-09 MED ORDER — OXYCODONE-ACETAMINOPHEN 5-325 MG PO TABS
1.0000 | ORAL_TABLET | ORAL | Status: DC | PRN
  Administered 2015-01-10 – 2015-01-11 (×3): 1 via ORAL
  Filled 2015-01-09 (×3): qty 1

## 2015-01-09 MED ORDER — IBUPROFEN 600 MG PO TABS
600.0000 mg | ORAL_TABLET | Freq: Four times a day (QID) | ORAL | Status: DC
  Administered 2015-01-09 – 2015-01-11 (×7): 600 mg via ORAL
  Filled 2015-01-09 (×7): qty 1

## 2015-01-09 MED ORDER — OXYTOCIN 40 UNITS IN LACTATED RINGERS INFUSION - SIMPLE MED: 62.5000 mL/h | INTRAVENOUS | Status: DC | PRN

## 2015-01-09 MED ORDER — ONDANSETRON HCL 4 MG/2ML IJ SOLN
4.0000 mg | Freq: Four times a day (QID) | INTRAMUSCULAR | Status: DC | PRN
  Administered 2015-01-09: 4 mg via INTRAVENOUS
  Filled 2015-01-09: qty 2

## 2015-01-09 MED ORDER — BENZOCAINE-MENTHOL 20-0.5 % EX AERO
1.0000 | INHALATION_SPRAY | CUTANEOUS | Status: DC | PRN
Start: 2015-01-09 — End: 2015-01-11

## 2015-01-09 MED ORDER — ACETAMINOPHEN 325 MG PO TABS: 650.0000 mg | ORAL_TABLET | ORAL | Status: DC | PRN

## 2015-01-09 MED ORDER — WITCH HAZEL-GLYCERIN EX PADS: 1.0000 "application " | MEDICATED_PAD | CUTANEOUS | Status: DC | PRN

## 2015-01-09 MED ORDER — TERBUTALINE SULFATE 1 MG/ML IJ SOLN
0.2500 mg | Freq: Once | INTRAMUSCULAR | Status: DC | PRN
  Filled 2015-01-09: qty 1

## 2015-01-09 MED ORDER — SENNOSIDES-DOCUSATE SODIUM 8.6-50 MG PO TABS
2.0000 | ORAL_TABLET | ORAL | Status: DC
  Administered 2015-01-09: 2 via ORAL
  Filled 2015-01-09 (×2): qty 2

## 2015-01-09 MED ORDER — LANOLIN HYDROUS EX OINT: TOPICAL_OINTMENT | CUTANEOUS | Status: DC | PRN

## 2015-01-09 NOTE — MAU Note (Signed)
Contractions started yesterday, were every 18 min last night.  Maybe a little closer now.  Spotting when started, no leaking.  Increase in swelling noted.  2 wks ago was 1+cm

## 2015-01-09 NOTE — Progress Notes (Signed)
Whitney Abbott is a 29 y.o. [id number] at 2616w2d by LMP admitted for induction of labor due to Hypertension.  Subjective: Patient doing well. Says contractions are getting more uncomfortable. Reports no prior postpartum or intrapartum issues w/ other deliveries. Was induced for 1st delivery, and now this one.  Objective: BP 142/87 mmHg  Pulse 87  Temp(Src) 98.1 F (36.7 C) (Oral)  Resp 16  Ht 5\' 4"  (1.626 m)  Wt 85.276 kg (188 lb)  BMI 32.25 kg/m2  LMP 04/09/2014      FHT:  FHR: 125 bpm, variability: moderate,  accelerations:  Present,  decelerations:  Absent UC:   regular, every 3-5 minutes  SVE:   Dilation: 1.5 Effacement (%): 60 Station: -3 Exam by:: Morrison Oldee Carter RN  Labs: Lab Results  Component Value Date   WBC 12.6* 01/09/2015   HGB 8.9* 01/09/2015   HCT 28.8* 01/09/2015   MCV 69.9* 01/09/2015   PLT 196 01/09/2015    Assessment / Plan: induction 2/2 gHTN. currently on cytotec  Provider placed cytotec due to contraction frequency  Labor: Progressing normally Preeclampsia:  labs stable Fetal Wellbeing:  Category I Pain Control:  Labor support without medications I/Abbott:  GBS pos -- on PCN Anticipated MOD:  NSVD  Whitney Abbott, Whitney Abbott 01/09/2015, 2:27 PM

## 2015-01-09 NOTE — Progress Notes (Signed)
FHR tracing maternal due to pt sitting straight up in bed

## 2015-01-09 NOTE — Progress Notes (Signed)
[place] applied

## 2015-01-09 NOTE — MAU Note (Signed)
Urine in lab 

## 2015-01-09 NOTE — Progress Notes (Signed)
Infant intially skin to skin for 2 minutes then taken to warmer to evaluate

## 2015-01-09 NOTE — H&P (Signed)
Whitney Abbott is a 29 y.o. female [phone] [id number] @ 39.2wks by LMP and confirmed by 28wk scan presenting for eval of irreg ctx and puffy hands. Denies H/A, visual changes and RUQ pain. No leaking or bldg. Has been having irreg ctx all night. Her preg has [facility] 27wks and has been remarkable for 1) late onset care 2) gHTN/preeclampisa with prev pregnancies 3) hx PTD x 2 @ 34 and 36wks  History OB History    Gravida Para Term Preterm AB TAB SAB Ectopic Multiple Living   7 5 3 2 1 1  0 0 0 5     Past Medical History  Diagnosis Date  . Pregnancy induced hypertension    Past Surgical History  Procedure Laterality Date  . No past surgeries     Family History: family history includes Cancer in her maternal grandmother; Hypertension in her paternal grandmother. Social History:  reports that she has never smoked. She has never used smokeless tobacco. She reports that she does not drink alcohol or use illicit drugs.   Prenatal Transfer Tool  Maternal Diabetes: No Genetic Screening: Declined- too late Maternal Ultrasounds/Referrals: Normal Fetal Ultrasounds or other Referrals:  None Maternal Substance Abuse:  No Significant Maternal Medications:  None Significant Maternal Lab Results:  Lab values include: Group B Strep positive Other Comments:  None  ROS  Dilation: 1.5 Effacement (%): 60 Station: -3 Exam by:: Coca ColaDee Carter RN Blood pressure 142/92, pulse 96, temperature 97.8 F (36.6 C), temperature source Oral, resp. rate 18, last menstrual period 04/09/2014, unknown if currently breastfeeding. Exam Physical Exam  Constitutional: She is oriented to person, place, and time. She appears well-developed.  HENT:  Head: Normocephalic.  Neck: Normal range of motion.  Cardiovascular:  Was tachycardic upon arrival; now nl rate  Respiratory: Effort normal.  GI:  EFM 120s, +accels, early variables with ctx Ctx irreg  Genitourinary:  Cx 1-2/60/-3 per RN exam   Musculoskeletal: Normal range of motion.  Neurological: She is alert and oriented to person, place, and time.  Skin: Skin is warm and dry.  Psychiatric: She has a normal mood and affect. Her behavior is normal. Thought content normal.    CBC    Component Value Date/Time   WBC 12.6* 01/09/2015 0940   RBC 4.12 01/09/2015 0940   HGB 8.9* 01/09/2015 0940   HGB 11.0 08/07/2014   HCT 28.8* 01/09/2015 0940   HCT 36 08/07/2014   PLT 196 01/09/2015 0940   PLT 311 08/07/2014   MCV 69.9* 01/09/2015 0940   MCH 21.6* 01/09/2015 0940   MCHC 30.9 01/09/2015 0940   RDW 18.1* 01/09/2015 0940   LYMPHSABS 2.2 11/14/2014 1648   MONOABS 0.5 11/14/2014 1648   EOSABS 0.1 11/14/2014 1648   BASOSABS 0.0 11/14/2014 1648   CMP     Component Value Date/Time   NA 134* 01/09/2015 0940   K 3.9 01/09/2015 0940   CL 109 01/09/2015 0940   CO2 22 01/09/2015 0940   GLUCOSE 92 01/09/2015 0940   BUN 5* 01/09/2015 0940   CREATININE 0.53 01/09/2015 0940   CALCIUM 8.4 01/09/2015 0940   PROT 6.6 01/09/2015 0940   ALBUMIN 2.6* 01/09/2015 0940   AST 15 01/09/2015 0940   ALT 10 01/09/2015 0940   ALKPHOS 193* 01/09/2015 0940   BILITOT 0.4 01/09/2015 0940   GFRNONAA >90 01/09/2015 0940   GFRAA >90 01/09/2015 0940   Urine pro/cr ratio: 0.34  Prenatal labs: ABO, Rh: A/Positive/-- (09/10 0000) Antibody:  Rubella: Immune (09/10 0000) RPR: NON REAC (11/23 1032)  HBsAg: Negative (09/10 0000)  HIV: NONREACTIVE (11/23 1032)  GBS:     Assessment/Plan: IUP@ 39.2wks gHTN w/ nl labs Unfavorable cx  Admit to [facility] Will use cytotec for ripening PCN G for GBS prophylaxis Anticipate SVD   Laynie Espy CNM 01/09/2015, 11:28 AM

## 2015-01-10 LAB — RPR: RPR Ser Ql: NONREACTIVE

## 2015-01-10 NOTE — Lactation Note (Signed)
This note was copied from the chart of Whitney Abbott. Lactation Consultation Note: Initial visit with mom. She reports that baby was feeding a lot and she wasn't sure how much the baby was getting. States her RN told her that was normal for babies to feed frequently. Reviewed baby needs small amounts frequently. Encouraged to rouse baby skin to skin to nurse if sleeping for more than 2-3 hours. Reports no pain with nursing. Baby asleep in dad's arms at present. Discussed feeding cues and encouraged to feed whenever she sees them. BF brochure given with resources for support after DC.No questions at present. To call prn  Patient Name: Whitney Hillery HunterSaketa Trautner [id number]'s Date: 01/10/2015 Reason for consult: Initial assessment   Maternal Data Formula Feeding for Exclusion: Yes Reason for exclusion: Mother's choice to formula and breast feed on admission Does the patient have breastfeeding experience prior to this delivery?: Yes  Feeding   LATCH Score/Interventions                      Lactation Tools Discussed/Used     Consult Status Consult Status: Follow-up Date: 01/11/15 Follow-up type: In-patient    Pamelia HoitWeeks, Brittnae Aschenbrenner D 01/10/2015, 11:30 AM

## 2015-01-10 NOTE — Progress Notes (Signed)
Post Partum Day 1 Subjective: no complaints, up ad lib, voiding, tolerating PO and + flatus  Objective: Blood pressure 130/94, pulse 69, temperature 97.3 F (36.3 C), temperature source Axillary, resp. rate 18, height $RemoveBefore EID_LvfFFiokuhdddSQmyWwgtYbkRFUPSlso$5\' 4"ual period 04/09/2014, SpO2 100 %, unknown if currently breastfeeding.  Physical Exam:  General: alert, cooperative, appears stated age and no distress Lochia: appropriate Uterine Fundus: firm Incision: n/a DVT Evaluation: No evidence of DVT seen on physical exam. No cords or calf tenderness. No significant calf/ankle edema.   Recent Labs  01/09/15 0940  HGB 8.9*  HCT 28.8*    Assessment/Plan: Plan for discharge tomorrow, Breastfeeding and Contraception depo   LOS: 1 day   Kathee DeltonMcKeag, Ian D 01/10/2015, 7:47 AM

## 2015-01-11 MED ORDER — IBUPROFEN 600 MG PO TABS: 600.0000 mg | ORAL_TABLET | Freq: Four times a day (QID) | ORAL | Status: DC | PRN

## 2015-01-11 NOTE — Lactation Note (Signed)
This note was copied from the chart of Whitney Hillery HunterSaketa Selden. Lactation Consultation Note  Mom is dressing the baby for discharge.  She reported that breast feeding is going well and denied any questions or concerns for the lactation consultant. Patient Name: Whitney Abbott ZOXWR'UToday's Date: 01/11/2015     Maternal Data    Feeding    LATCH Score/Interventions                      Lactation Tools Discussed/Used     Consult Status      [name], [name] 01/11/2015, 11:03 AM

## 2015-01-11 NOTE — Discharge Summary (Signed)
Obstetric Discharge Summary Reason for Admission: induction of labor of GHTN Prenatal Procedures: none Intrapartum Procedures: spontaneous vaginal delivery Postpartum Procedures: none Complications-Operative and Postpartum: none HEMOGLOBIN  Date Value Ref Range Status  01/09/2015 8.9* 12.0 - 15.0 g/dL Final  16/10/960409/08/2014 54.011.0 g/dL Final   HCT  Date Value Ref Range Status  01/09/2015 28.8* 36.0 - 46.0 % Final  08/07/2014 36 % Final   At 3:50 PM a viable female was delivered via Vaginal, Spontaneous Delivery (Presentation: ; ). APGAR: 7, 8; weight 7 lb 5.8 oz (3340 g).  Placenta status: Intact, Spontaneous. Cord: 3 vessels with the following complications: None.  Delivery had been partially unwitnessed. Patient had urge to push. Lower torso covered by sheet at the time. Upon sheet removal baby's head was delivered. Complete delivery occurred w/o any issues. No significant bleeding postpartum. No lacerations.    Physical Exam:  General: alert, cooperative and no distress Lochia: appropriate Uterine Fundus: firm Incision: n/a DVT Evaluation: No evidence of DVT seen on physical exam. Negative Homan's sign. No cords or calf tenderness. No significant calf/ankle edema.  Discharge Diagnoses: Term Pregnancy-delivered  Discharge Information: Date: 01/11/2015 Activity: pelvic rest Diet: routine Medications: Ibuprofen Condition: stable Instructions: refer to practice specific booklet Discharge to: home Contraception:  Depo; instructed not to have intercourse til she received her Depo  Newborn Data: Live born female  Birth Weight: 7 lb 5.8 oz (3340 g) APGAR: 7, 8  Home with mother.  Ross,Lisa Joen LauraWynne 01/11/2015, 7:28 AM   I spoke with and examined patient and agree with resident/PA/SNM's note and plan of care.  Cheral MarkerKimberly R. Tekeshia Klahr, CNM, [facility] Inc[facility]NP-BC 01/11/2015 8:02 AM

## 2015-01-11 NOTE — Discharge Instructions (Signed)

## 2015-01-12 ENCOUNTER — Encounter: Payer: Medicaid Other | Admitting: Obstetrics and Gynecology

## 2015-01-12 NOTE — Progress Notes (Signed)
Post discharge ur review completed. 

## 2015-01-19 ENCOUNTER — Encounter: Payer: Medicaid Other | Admitting: Obstetrics and Gynecology

## 2015-02-13 ENCOUNTER — Encounter: Payer: Self-pay | Admitting: Obstetrics & Gynecology

## 2015-02-13 ENCOUNTER — Ambulatory Visit (INDEPENDENT_AMBULATORY_CARE_PROVIDER_SITE_OTHER): Payer: Medicaid Other | Admitting: Obstetrics & Gynecology

## 2015-02-13 DIAGNOSIS — Z3042 Encounter for surveillance of injectable contraceptive: Secondary | ICD-10-CM

## 2015-02-13 DIAGNOSIS — Z3202 Encounter for pregnancy test, result negative: Secondary | ICD-10-CM

## 2015-02-13 DIAGNOSIS — Z30013 Encounter for initial prescription of injectable contraceptive: Secondary | ICD-10-CM

## 2015-02-13 DIAGNOSIS — G8929 Other chronic pain: Secondary | ICD-10-CM

## 2015-02-13 DIAGNOSIS — M25551 Pain in right hip: Secondary | ICD-10-CM

## 2015-02-13 LAB — POCT PREGNANCY, URINE: PREG TEST UR: NEGATIVE

## 2015-02-13 MED ORDER — MEDROXYPROGESTERONE ACETATE 150 MG/ML IM SUSP
150.0000 mg | INTRAMUSCULAR | Status: AC
  Administered 2015-02-13 – 2016-01-13 (×2): 150 mg via INTRAMUSCULAR

## 2015-02-13 NOTE — Progress Notes (Signed)
     Subjective:     Whitney Abbott is a 29 y.o. [id number] [id number] female who presents for a postpartum visit. She is 5 weeks postpartum following a spontaneous vaginal delivery. I have fully reviewed the prenatal and intrapartum course. The delivery was at 39.2 gestational weeks.  Anesthesia: none. Postpartum course has been uncomplicated. Baby's course has been uncomplicated. Baby is feeding by bottle. Bleeding no bleeding. Bowel function is normal. Bladder function is normal. Patient is not sexually active. Contraception method is Depo-Provera injections. Postpartum depression screening: negative.  Patient reports R hip pain that started during pregnancy and has persisted afterwards. Worse when bearing weight on the hip. Able to move extremity freely, no loss of strength or sensation.  The following portions of the patient's history were reviewed and updated as appropriate: allergies, current medications, past family history, past medical history, past social history, past surgical history and problem list.  Normal pap in 10/20/14.  Review of Systems Pertinent items are noted in HPI.   Objective:    BP 151/94 mmHg  Pulse 53  Temp(Src) 98.3 F (36.8 C)  Wt 176 lb 4.8 oz (79.969 kg)  Breastfeeding? No  General:  alert and no distress   Breasts:  deferred  Lungs: clear to auscultation bilaterally  Heart:  regular rate and rhythm  Abdomen: soft, non-tender; bowel sounds normal; no masses,  no organomegaly   Pelvic:  normal        Assessment:   Normal postpartum exam. Pap smear not done at today's visit.   Plan:    1. Contraception: Depo-Provera injections 2. Refer to Sports Medicine 3. Follow up in: 3 months for Depo Provera or as needed.    Jaynie CollinsUGONNA  Zailynn Brandel, MD, FACOG Attending Obstetrician & Gynecologist Faculty Practice, [facility] [place]

## 2015-02-13 NOTE — Progress Notes (Signed)
Patient ID: Seward GraterSaketa N Abbott, female   DOB: 06/21/1986, 29 y.o.   MRN: [id number] Desires Depo Provera. Pt states she has intense sharp pain in R hip twice/day since delivery.

## 2015-02-13 NOTE — Patient Instructions (Signed)
Return to clinic for any scheduled appointments or for any gynecologic concerns as needed.   

## 2015-02-19 ENCOUNTER — Ambulatory Visit: Payer: Medicaid Other | Admitting: Sports Medicine

## 2015-05-18 ENCOUNTER — Ambulatory Visit: Payer: Medicaid Other

## 2015-11-16 ENCOUNTER — Emergency Department (HOSPITAL_COMMUNITY)
Admission: EM | Admit: 2015-11-16 | Discharge: 2015-11-16 | Disposition: A | Discharge status: Home / Self Care | Payer: Medicaid Other | Attending: Emergency Medicine | Admitting: Emergency Medicine

## 2015-11-16 ENCOUNTER — Encounter (HOSPITAL_COMMUNITY): Payer: Self-pay

## 2015-11-16 ENCOUNTER — Emergency Department (HOSPITAL_COMMUNITY): Payer: Medicaid Other

## 2015-11-16 DIAGNOSIS — R519 Headache, unspecified: Secondary | ICD-10-CM

## 2015-11-16 DIAGNOSIS — G510 Bell's palsy: Secondary | ICD-10-CM | POA: Diagnosis not present

## 2015-11-16 DIAGNOSIS — R590 Localized enlarged lymph nodes: Secondary | ICD-10-CM | POA: Diagnosis not present

## 2015-11-16 DIAGNOSIS — R51 Headache: Secondary | ICD-10-CM

## 2015-11-16 DIAGNOSIS — R200 Anesthesia of skin: Secondary | ICD-10-CM | POA: Diagnosis present

## 2015-11-16 LAB — I-STAT CHEM 8, ED
BUN: 7 mg/dL (ref 6–20)
CALCIUM ION: 1.2 mmol/L (ref 1.12–1.23)
Chloride: 106 mmol/L (ref 101–111)
Creatinine, Ser: 0.9 mg/dL (ref 0.44–1.00)
Glucose, Bld: 88 mg/dL (ref 65–99)
HCT: 38 % (ref 36.0–46.0)
HEMOGLOBIN: 12.9 g/dL (ref 12.0–15.0)
Potassium: 3.8 mmol/L (ref 3.5–5.1)
SODIUM: 141 mmol/L (ref 135–145)
TCO2: 25 mmol/L (ref 0–100)

## 2015-11-16 LAB — I-STAT BETA HCG BLOOD, ED (MC, WL, AP ONLY)

## 2015-11-16 MED ORDER — PREDNISONE 20 MG PO TABS: 60.0000 mg | ORAL_TABLET | Freq: Every day | ORAL | Status: AC

## 2015-11-16 MED ORDER — HYPROMELLOSE (GONIOSCOPIC) 2.5 % OP SOLN: 1.0000 [drp] | OPHTHALMIC | Status: AC

## 2015-11-16 MED ORDER — DEXAMETHASONE SODIUM PHOSPHATE 10 MG/ML IJ SOLN
10.0000 mg | Freq: Once | INTRAMUSCULAR | Status: AC
  Administered 2015-11-16: 10 mg via INTRAVENOUS
  Filled 2015-11-16: qty 1

## 2015-11-16 MED ORDER — SODIUM CHLORIDE 0.9 % IV BOLUS (SEPSIS)
1000.0000 mL | Freq: Once | INTRAVENOUS | Status: AC
  Administered 2015-11-16: 1000 mL via INTRAVENOUS

## 2015-11-16 MED ORDER — PROCHLORPERAZINE EDISYLATE 5 MG/ML IJ SOLN
10.0000 mg | Freq: Once | INTRAMUSCULAR | Status: AC
  Administered 2015-11-16: 10 mg via INTRAVENOUS
  Filled 2015-11-16: qty 2

## 2015-11-16 MED ORDER — LORAZEPAM 1 MG PO TABS
1.0000 mg | ORAL_TABLET | Freq: Once | ORAL | Status: AC
  Administered 2015-11-16: 1 mg via ORAL
  Filled 2015-11-16: qty 1

## 2015-11-16 MED ORDER — VALACYCLOVIR HCL 1 G PO TABS: 1000.0000 mg | ORAL_TABLET | Freq: Three times a day (TID) | ORAL | Status: AC

## 2015-11-16 MED ORDER — DIPHENHYDRAMINE HCL 50 MG/ML IJ SOLN
25.0000 mg | Freq: Once | INTRAMUSCULAR | Status: AC
  Administered 2015-11-16: 25 mg via INTRAVENOUS
  Filled 2015-11-16: qty 1

## 2015-11-16 MED ORDER — ARTIFICIAL TEARS OP OINT: TOPICAL_OINTMENT | OPHTHALMIC | Status: AC

## 2015-11-16 NOTE — ED Notes (Signed)
Pt. Woke up this am and had her rt. Eye twitching and a headache.  She went to bed and took an Ibuprofen and when she woke up. She was having rt. Facial numbness, unable to close her rt. Eye.   No facial drooping.  LT. Eye is twitching.   Pt. Denies any headache at present time

## 2015-11-16 NOTE — ED Notes (Signed)
Patient transported to MRI 

## 2015-11-16 NOTE — Discharge Instructions (Signed)
Bell Palsy °Bell palsy is a condition in which the muscles on one side of the face become paralyzed. This often causes one side of the face to droop. It is a common condition and most people recover completely. °RISK FACTORS °Risk factors for Bell palsy include: °· Pregnancy. °· Diabetes. °· An infection by a virus, such as infections that cause cold sores. °CAUSES  °Bell palsy is caused by damage to or inflammation of a nerve in your face. It is unclear why this happens, but an infection by a virus may lead to it. Most of the time the reason it happens is unknown. °SIGNS AND SYMPTOMS  °Symptoms can range from mild to severe and can take place over a number of hours. Symptoms may include: °· Being unable to: °¨ Raise one or both eyebrows. °¨ Close one or both eyes. °¨ Feel parts of your face (facial numbness). °· Drooping of the eyelid and corner of the mouth. °· Weakness in the face. °· Paralysis of half your face. °· Loss of taste. °· Sensitivity to loud noises. °· Difficulty chewing. °· Tearing up of the affected eye. °· Dryness in the affected eye. °· Drooling. °· Pain behind one ear. °DIAGNOSIS  °Diagnosis of Bell palsy may include: °· A medical history and physical exam. °· An MRI. °· A CT scan. °· Electromyography (EMG). This is a test that checks how your nerves are working. °TREATMENT  °Treatment may include antiviral medicine to help shorten the length of the condition. Sometimes treatment is not needed and the symptoms go away on their own. °HOME CARE INSTRUCTIONS  °· Take medicines only as directed by your health care provider. °· Do facial massages and exercises as directed by your health care provider. °· If your eye is affected: °¨ Use moisturizing eye drops to prevent drying of your eye as directed by your health care provider. °¨ Protect your eye as directed by your health care provider. °SEEK MEDICAL CARE IF: °· Your symptoms do not get better or get worse. °· You are drooling. °· Your eye is red,  irritated, or hurts. °SEEK IMMEDIATE MEDICAL CARE IF:  °· Another part of your body feels weak or numb. °· You have difficulty swallowing. °· You have a fever along with symptoms of Bell palsy. °· You develop neck pain. °MAKE SURE YOU:  °· Understand these instructions. °· Will watch your condition. °· Will get help right away if you are not doing well or get worse. °  °This information is not intended to replace advice given to you by your health care provider. Make sure you discuss any questions you have with your health care provider. °  °Document Released: 11/14/2005 Document Revised: 08/05/2015 Document Reviewed: 02/21/2014 °Elsevier Interactive Patient Education ©2016 [employer] ° °

## 2015-11-16 NOTE — ED Notes (Signed)
Pt. Left with all belongings and refused wheelchair. Discharge instructions were reviewed and all questions were answered.  

## 2015-11-16 NOTE — ED Provider Notes (Signed)
CSN: [id number]     Arrival date & time 11/16/15  1615 History   First MD Initiated Contact with Patient 11/16/15 1626     Chief Complaint  Patient presents with  . Numbness   29 year old African-American female who presents today with acute onset and right-sided facial paralysis. Patient notes that she has had a headache for 4 days in the right side of her head and neck. And today noticed when she went to take a drink at her right lip wasn't moving very well. She looked in the mirror and noticed that she couldn't really close her right eye either. Headache isn't throbbing aching sensation, does not radiate. Responds mildly into Tylenol at home. She denies CP, SOB, fever, chills, N/V, diarrhea, constipation, hematemesis, dysuria, hematuria, sick contacts, or recent travel.   Patient is a 29 y.o. female presenting with general illness.  Illness Location:  Right facial paralysis Severity:  Moderate Onset quality:  Sudden Duration:  4 hours Timing:  Constant Progression:  Unchanged Chronicity:  New Associated symptoms: headaches (right sided)   Associated symptoms: no abdominal pain, no chest pain, no diarrhea, no fever and no shortness of breath     Past Medical History  Diagnosis Date  . Pregnancy induced hypertension    Past Surgical History  Procedure Laterality Date  . No past surgeries     Family History  Problem Relation Age of Onset  . Cancer Maternal Grandmother   . Hypertension Paternal Grandmother    Social History  Substance Use Topics  . Smoking status: Never Smoker   . Smokeless tobacco: Never Used  . Alcohol Use: No   OB History    Gravida Para Term Preterm AB TAB SAB Ectopic Multiple Living   0 0 0 2     Review of Systems  Constitutional: Negative for fever and chills.  Respiratory: Negative for shortness of breath.   Cardiovascular: Negative for chest pain, palpitations and leg swelling.  Gastrointestinal: Negative for abdominal pain,  diarrhea, constipation and abdominal distention.  Genitourinary: Negative for dysuria, frequency, flank pain and decreased urine volume.  Neurological: Positive for facial asymmetry (right face not moving) and headaches (right sided). Negative for dizziness, speech difficulty, weakness, light-headedness and numbness.  All other systems reviewed and are negative.     Allergies  Review of patient's allergies indicates no known allergies.  Home Medications   Prior to Admission medications   Medication Sig Start Date End Date Taking? Authorizing Provider  ibuprofen (ADVIL,MOTRIN) 200 MG tablet Take 800 mg by mouth every 6 (six) hours as needed for mild pain.   Yes Historical Provider, MD  artificial tears (LACRILUBE) OINT ophthalmic ointment Place into the right eye See admin instructions. Every evening 11/16/15   Rachelle Hora, MD  hydroxypropyl methylcellulose / hypromellose (ISOPTO TEARS / GONIOVISC) 2.5 % ophthalmic solution Place 1 drop into the right eye every hour while awake. 11/16/15   Rachelle Hora, MD  predniSONE (DELTASONE) 20 MG tablet Take 3 tablets (60 mg total) by mouth daily. 11/16/15 11/23/15  Rachelle Hora, MD  valACYclovir (VALTREX) 1000 MG tablet Take 1 tablet (1,000 mg total) by mouth 3 (three) times daily. 11/16/15 11/30/15  Rachelle Hora, MD   BP 124/83 mmHg  Pulse 60  Temp(Src) 97.4 F (36.3 C) (Oral)  Resp 14  SpO2 100%  LMP 11/09/2015  Breastfeeding? No Physical Exam  Constitutional: She is oriented to person, place, and time. She appears well-developed and well-nourished. No distress.  HENT:  Head: Normocephalic and atraumatic.    Eyes: Pupils are equal, round, and reactive to light.  Neck: Normal range of motion.  Cardiovascular: Normal rate, regular rhythm, normal heart sounds and intact distal pulses.  Exam reveals no gallop and no friction rub.   No murmur heard. Pulmonary/Chest: Effort normal and breath sounds normal. No respiratory distress. She has no wheezes.  She has no rales. She exhibits no tenderness.  Abdominal: Soft. Bowel sounds are normal. She exhibits no distension and no mass. There is no tenderness. There is no rebound and no guarding.  Lymphadenopathy:    She has cervical adenopathy ( 1small, nontender LN in posterior cervical chain. This is where pt describes her HA is located).  Neurological: She is alert and oriented to person, place, and time. A cranial nerve deficit (right sided facial paralysis, sensation in tact) is present.  Skin: Skin is warm and dry. She is not diaphoretic.  Nursing note and vitals reviewed.   ED Course  Procedures (including critical care time) Labs Review Labs Reviewed  I-STAT BETA HCG BLOOD, ED ([facility], [facility], [facility] ONLY)  I-STAT CHEM 8, ED    Imaging Review Mr Brain Wo Contrast  11/16/2015  CLINICAL DATA:  Woke up with RIGHT eye twitching and headache, not improved with ibuprofen. Unable to close RIGHT eye. RIGHT facial numbness. EXAM: MRI HEAD WITHOUT CONTRAST TECHNIQUE: Multiplanar, multiecho pulse sequences of the brain and surrounding structures were obtained without intravenous contrast. COMPARISON:  None. FINDINGS: The ventricles and sulci are normal. No abnormal parenchymal signal, mass lesions, mass effect. No reduced diffusion to suggest acute ischemia. No susceptibility artifact to suggest hemorrhage. No abnormal extra-axial fluid collections. No extra-axial masses though, contrast enhanced sequences would be more sensitive. Normal major intracranial vascular flow voids seen at the skull base. 5 mm pineal cyst. Ocular globes and orbital contents are unremarkable though not tailored for evaluation. No abnormal sellar expansion. No suspicious calvarial bone marrow signal. Craniocervical junction maintained. Minimal paranasal sinus mucosal thickening without air-fluid levels. The mastoid air cells are well aerated. IMPRESSION: Negative noncontrast MRI brain. Electronically Signed   By: Awilda Metroourtnay  Bloomer M.D.    On: 11/16/2015 21:24   I have personally reviewed and evaluated these images and lab results as part of my medical decision-making.   EKG Interpretation None      MDM   Final diagnoses:  Throbbing headache  Bell's palsy   29 year old African female with right-sided facial paralysis, consistent with Bell's palsy. Also possibly atypical migraine. Given migraine cocktail with some improvement in her headache.  MRI normal.  Patient has been given Decadron in her migraine cocktail here. Will discharge her with Bell's palsy care: Antiviral, prednisone, artificial tears and gel. Follow up to PCP in one week.  Pt was seen under the supervision of Dr. Manus Gunningancour.     Rachelle HoraKeri Happy Ky, MD 11/16/15 [id number]  Glynn OctaveStephen Rancour, MD 11/17/15 [phone]

## 2016-01-13 ENCOUNTER — Ambulatory Visit (INDEPENDENT_AMBULATORY_CARE_PROVIDER_SITE_OTHER): Payer: Medicaid Other | Admitting: *Deleted

## 2016-01-13 DIAGNOSIS — Z3042 Encounter for surveillance of injectable contraceptive: Secondary | ICD-10-CM | POA: Diagnosis present

## 2016-02-22 IMAGING — US US OB COMP +14 WK
1 series · 12 of 28 positions shown · non-contrast
Comparison: none

[Series 1: us ob comp +14 wk mfm · 88 acquisitions, 12 frames shown]
[im 4/88]
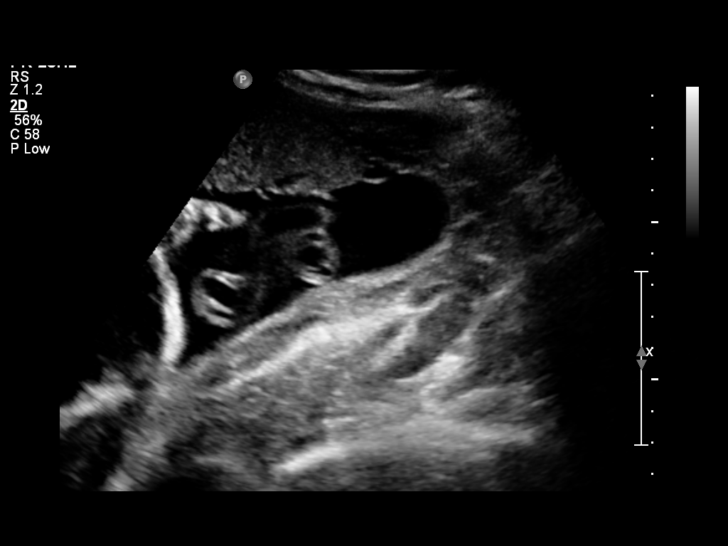
[im 10/88]
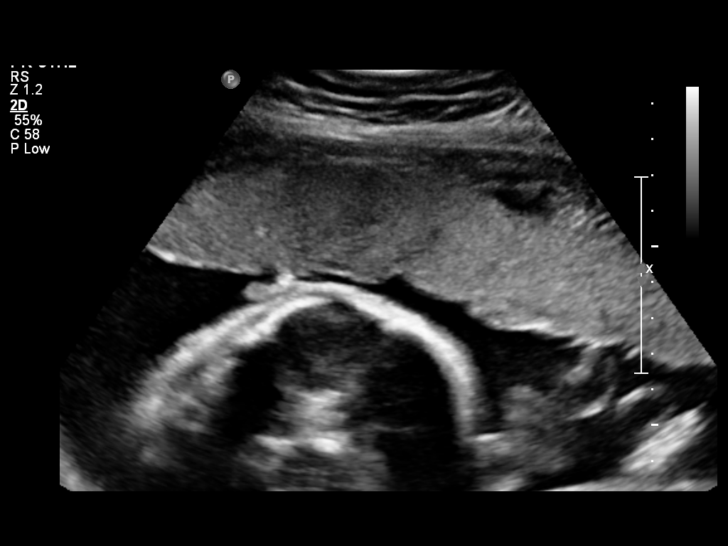
[im 17/88]
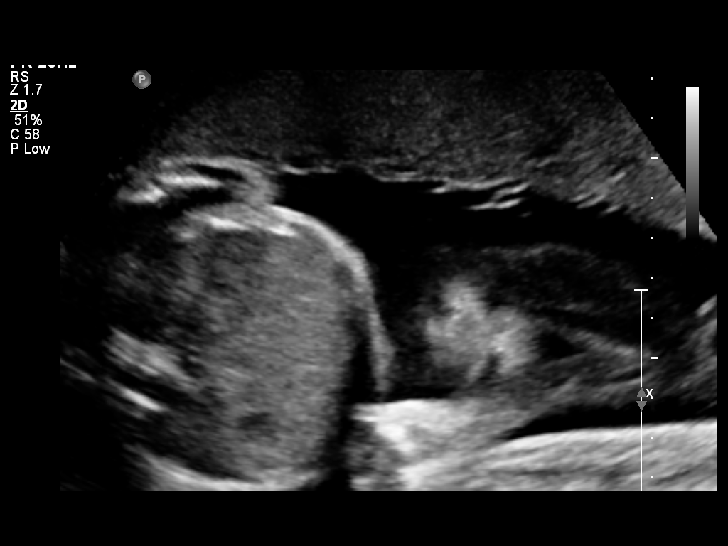
[im 26/88]
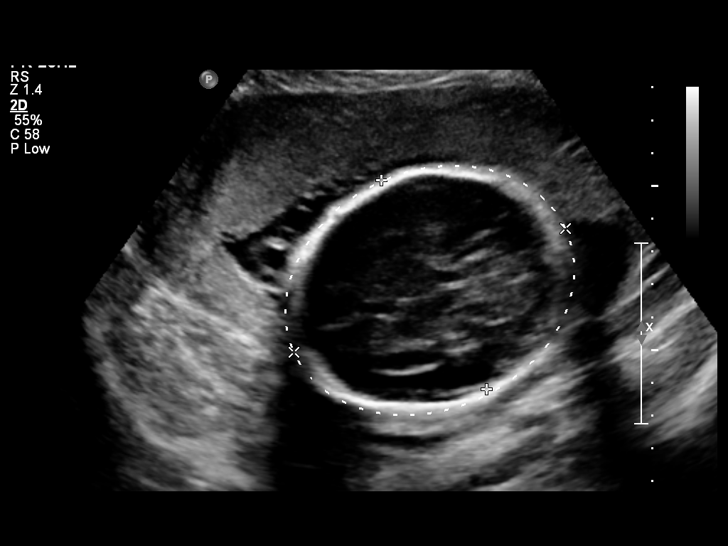
[im 33/88]
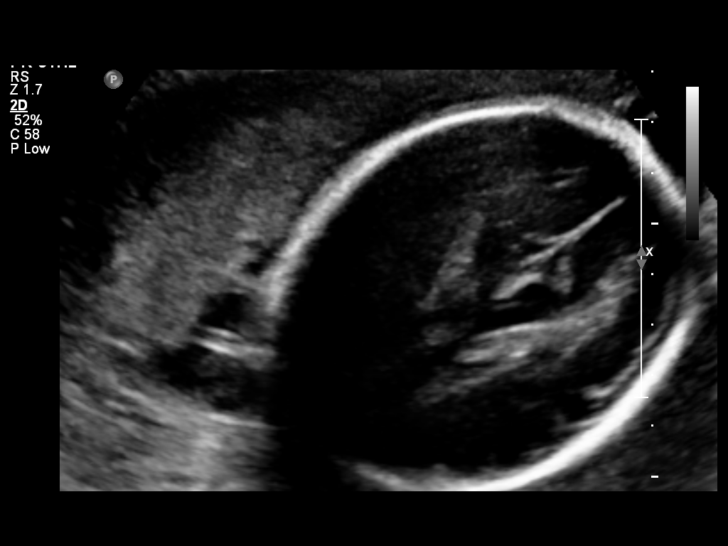
[im 39/88]
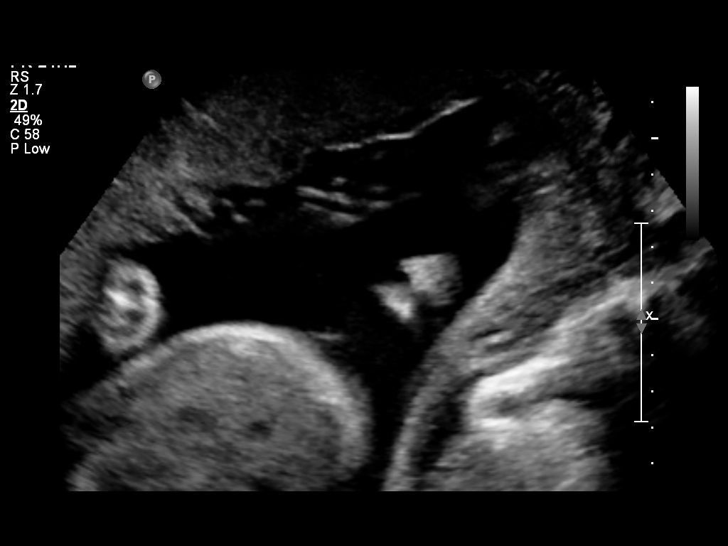
[im 49/88]
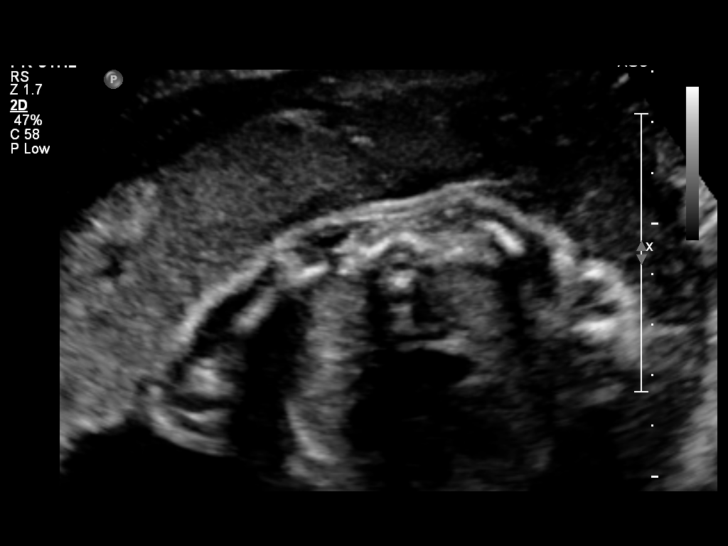
[im 55/88]
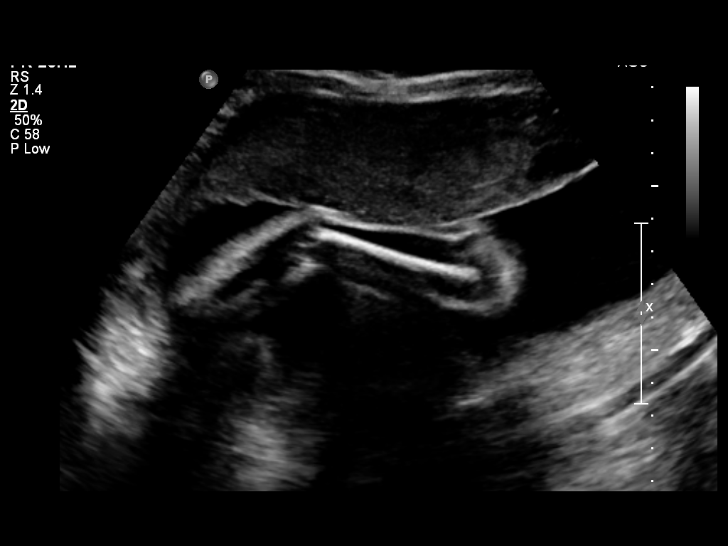
[im 62/88]
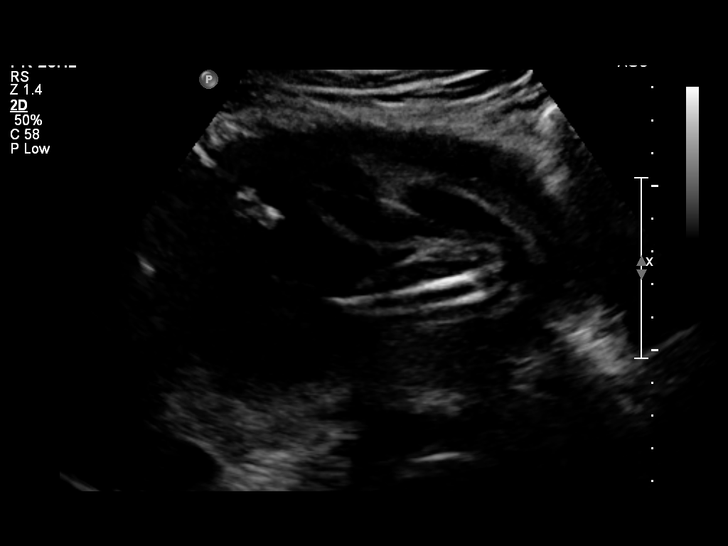
[im 71/88]
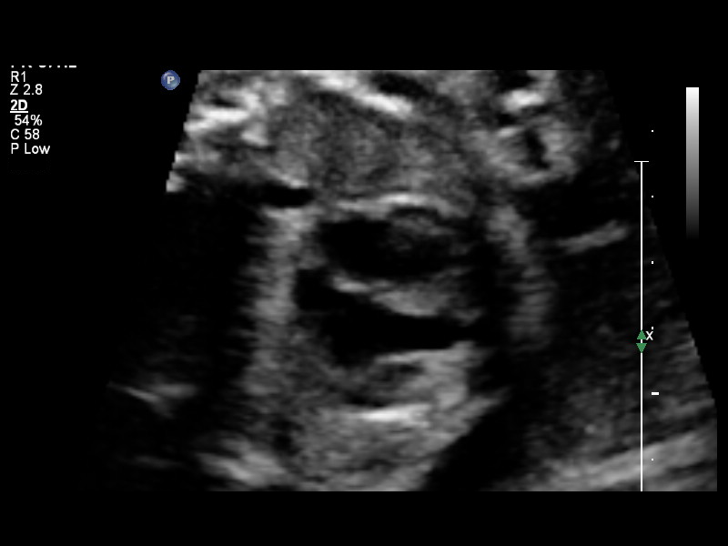
[im 78/88]
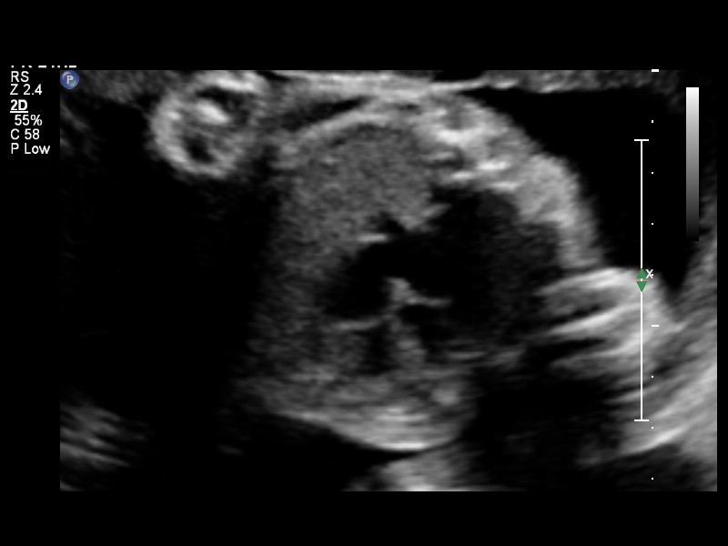
[im 84/88]
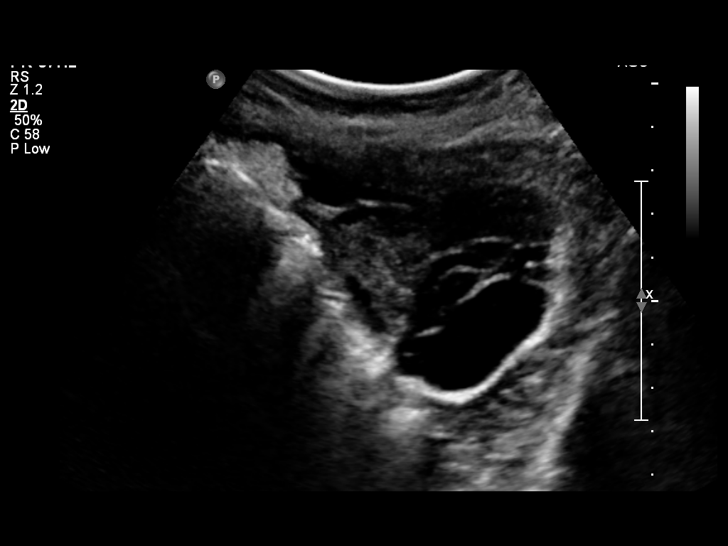

[12 of 28 positions shown; findings below may reference images not displayed]

OBSTETRICS REPORT
                      (Signed Final 10/22/2014 [DATE])

Service(s) Provided

 US OB COMP + 14 WK                                    76805.1
Indications

 Basic anatomic survey                                 [id number]
 28 weeks gestation of pregnancy
 Poor obstetric history: Previous preterm
 delivery(34w,36w)
 Poor obstetric history: Previous preeclampsia /
 eclampsia/gestational HTN
Fetal Evaluation

 Num Of Fetuses:    1
 Fetal Heart Rate:  135                          bpm
 Cardiac Activity:  Observed
 Presentation:      Variable
 Placenta:          Anterior, above cervical os
 P. Cord            Visualized
 Insertion:

 Amniotic Fluid
 AFI FV:      Subjectively within normal limits
 AFI Sum:     20.32   cm       81  %Tile     Larg Pckt:       6  cm
 RUQ:   5.47    cm   RLQ:    6      cm    LUQ:   3.63    cm   LLQ:    5.22   cm
Biometry

 BPD:       71  mm     G. Age:  28w 4d                CI:        76.39   70 - 86
                                                      FL/HC:      20.9   18.8 -

 HC:     257.4  mm     G. Age:  28w 0d       20  %    HC/AC:      1.04   1.05 -

 AC:     247.4  mm     G. Age:  29w 0d       72  %    FL/BPD:     75.9   71 - 87
 FL:      53.9  mm     G. Age:  28w 4d       51  %    FL/AC:      21.8   20 - 24
 HUM:     50.5  mm     G. Age:  29w 4d       78  %

 Est. FW:    9331  gm    2 lb 13 oz      65  %
Gestational Age

 LMP:           28w 0d        Date:  04/09/14                 EDD:   01/14/15
 U/S Today:     28w 4d                                        EDD:   01/10/15
 Best:          28w 0d     Det. By:  LMP  (04/09/14)          EDD:   01/14/15
Anatomy

 Cranium:          Appears normal         Aortic Arch:      Appears normal
 Fetal Cavum:      Appears normal         Ductal Arch:      Not well visualized
 Ventricles:       Appears normal         Diaphragm:        Not well visualized
 Choroid Plexus:   Appears normal         Stomach:          Appears normal, left
                                                            sided
 Cerebellum:       Appears normal         Abdomen:          Appears normal
 Posterior Fossa:  Appears normal         Abdominal Wall:   Not well visualized
 Nuchal Fold:      Not applicable (>20    Cord Vessels:     Appears normal (3
                   wks GA)                                  vessel cord)
 Face:             Not well visualized    Kidneys:          Appear normal
 Lips:             Appears normal         Bladder:          Appears normal
 Heart:            Not well visualized    Spine:            Appears normal
 RVOT:             Not well visualized    Lower             Appears normal
                                          Extremities:
 LVOT:             Not well visualized    Upper             Appears normal
                                          Extremities:

 Other:  Female gender. Heels visualized. Technically difficult due to fetal
         position.
Cervix Uterus Adnexa

 Cervical Length:    4.6      cm

 Cervix:       Normal appearance by transabdominal scan.
 Left Ovary:    Within normal limits.
 Right Ovary:   Within normal limits.

 Adnexa:     No abnormality visualized.
Impression

 SIUP at 61w0d (remote read)
 EFW 65th%'le
 No dysmorphic features, limitations documented above
 No previa
Recommendations

 Recommend follow up attempt to complete fetal survey in 6-8
 weeks.

 questions or concerns.

## 2016-03-30 ENCOUNTER — Ambulatory Visit: Payer: Medicaid Other
